# Patient Record
Sex: Female | Born: 2005 | Race: Black or African American | Hispanic: No | Marital: Single | Smoking: Never smoker
Health system: Southern US, Community
[De-identification: ages and names within clinical notes are randomized; demographics above are authoritative.]

## PROBLEM LIST (undated history)

## (undated) DIAGNOSIS — K219 Gastro-esophageal reflux disease without esophagitis: Secondary | ICD-10-CM

## (undated) DIAGNOSIS — F1910 Other psychoactive substance abuse, uncomplicated: Secondary | ICD-10-CM

## (undated) DIAGNOSIS — F419 Anxiety disorder, unspecified: Secondary | ICD-10-CM

## (undated) DIAGNOSIS — F32A Depression, unspecified: Secondary | ICD-10-CM

## (undated) DIAGNOSIS — J45909 Unspecified asthma, uncomplicated: Secondary | ICD-10-CM

## (undated) DIAGNOSIS — D649 Anemia, unspecified: Secondary | ICD-10-CM

## (undated) DIAGNOSIS — T7840XA Allergy, unspecified, initial encounter: Secondary | ICD-10-CM

## (undated) DIAGNOSIS — M810 Age-related osteoporosis without current pathological fracture: Secondary | ICD-10-CM

## (undated) HISTORY — DX: Age-related osteoporosis without current pathological fracture: M81.0

## (undated) HISTORY — DX: Allergy, unspecified, initial encounter: T78.40XA

## (undated) HISTORY — DX: Anxiety disorder, unspecified: F41.9

## (undated) HISTORY — DX: Unspecified asthma, uncomplicated: J45.909

## (undated) HISTORY — DX: Depression, unspecified: F32.A

## (undated) HISTORY — DX: Anemia, unspecified: D64.9

## (undated) HISTORY — DX: Other psychoactive substance abuse, uncomplicated: F19.10

## (undated) HISTORY — DX: Gastro-esophageal reflux disease without esophagitis: K21.9

---
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
## 2023-08-03 ENCOUNTER — Ambulatory Visit
Admission: EM | Admit: 2023-08-03 | Discharge: 2023-08-03 | Disposition: A | Discharge status: Home / Self Care | Attending: Family Medicine | Admitting: Family Medicine

## 2023-08-03 DIAGNOSIS — R3989 Other symptoms and signs involving the genitourinary system: Secondary | ICD-10-CM | POA: Diagnosis present

## 2023-08-03 DIAGNOSIS — N3001 Acute cystitis with hematuria: Secondary | ICD-10-CM | POA: Insufficient documentation

## 2023-08-03 DIAGNOSIS — R300 Dysuria: Secondary | ICD-10-CM | POA: Diagnosis present

## 2023-08-03 LAB — POCT URINALYSIS DIP (MANUAL ENTRY)
Bilirubin, UA: NEGATIVE
Glucose, UA: NEGATIVE mg/dL
Ketones, POC UA: NEGATIVE mg/dL
Nitrite, UA: POSITIVE — AB
Protein Ur, POC: NEGATIVE mg/dL
Spec Grav, UA: 1.01 (ref 1.010–1.025)
Urobilinogen, UA: 0.2 U/dL
pH, UA: 7.5 (ref 5.0–8.0)

## 2023-08-03 LAB — POCT URINE PREGNANCY: Preg Test, Ur: NEGATIVE

## 2023-08-03 MED ORDER — CEPHALEXIN 500 MG PO CAPS: 500.0000 mg | ORAL_CAPSULE | Freq: Two times a day (BID) | ORAL | 0 refills | Status: DC

## 2023-08-03 MED ORDER — FLUCONAZOLE 150 MG PO TABS: 150.0000 mg | ORAL_TABLET | ORAL | 0 refills | Status: DC

## 2023-08-03 NOTE — ED Triage Notes (Signed)
 Pt c/o of bladder,pressure after voiding on and off for 3 months and tick bite right lower abdomen on Sunday.Home intervention-AZO capsules.

## 2023-08-03 NOTE — ED Provider Notes (Signed)
 [facility]  Note:  This document was prepared using Conservation officer, historic buildings and may include unintentional dictation errors.  MRN: [id number] DOB: 29-Jul-2005  Subjective:   Yvette Kerr is a 18 y.o. female presenting for 63-month history of intermittent pelvic pressure, bladder pressure.  Her particular current symptoms started 5 days ago.  Has had dysuria and urinary urgency.  No vaginal discharge.  Would like to be checked for STIs just to be sure.  She has previously had difficulty with a yeast infection or BV infections.  Denies any particular vaginal malodor, thick vaginal discharge.  Reports that she has had minimal discharge and no odor.  No concern for pregnancy but is not opposed to testing.  Hydrates with 1-2 bottles of water daily, drinks a lot of fruit juice.  Limits other urinary irritants.  No current facility-administered medications for this encounter.  Current Outpatient Medications:    mirtazapine (REMERON) 7.5 MG tablet, Take 1-2 tablets (7.5-15 mg total) by mouth at bedtime., Disp: 30 tablet, Rfl: 0   No Known Allergies  History reviewed. No pertinent past medical history.   History reviewed. No pertinent surgical history.  No family history on file.  Social History   Tobacco Use   Smoking status: Former    Types: Cigarettes    Passive exposure: Yes  Vaping Use   Vaping status: Every Day  Substance Use Topics   Drug use: Yes    Types: Marijuana    ROS   Objective:   Vitals: BP 113/81 (BP Location: Left Arm)   Pulse 87   Temp 99.2 F (37.3 C) (Oral)   Resp 20   LMP 06/24/2023 (Approximate)   SpO2 98%   Physical Exam Constitutional:      General: She is not in acute distress.    Appearance: Normal appearance. She is well-developed. She is not ill-appearing, toxic-appearing or diaphoretic.  HENT:     Head: Normocephalic and atraumatic.     Nose: Nose normal.     Mouth/Throat:     Mouth: Mucous membranes  are moist.     Pharynx: Oropharynx is clear.  Eyes:     General: No scleral icterus.       Right eye: No discharge.        Left eye: No discharge.     Extraocular Movements: Extraocular movements intact.     Conjunctiva/sclera: Conjunctivae normal.  Cardiovascular:     Rate and Rhythm: Normal rate.  Pulmonary:     Effort: Pulmonary effort is normal.  Abdominal:     General: Bowel sounds are normal. There is no distension.     Palpations: Abdomen is soft. There is no mass.     Tenderness: There is no abdominal tenderness. There is no right CVA tenderness, left CVA tenderness, guarding or rebound.  Skin:    General: Skin is warm and dry.  Neurological:     General: No focal deficit present.     Mental Status: She is alert and oriented to person, place, and time.  Psychiatric:        Mood and Affect: Mood normal.        Behavior: Behavior normal.        Thought Content: Thought content normal.        Judgment: Judgment normal.     Results for orders placed or performed during the hospital encounter of 08/03/23 (from the past 24 hours)  POCT urine pregnancy     Status: None  Collection Time: 08/03/23  1:52 PM  Result Value Ref Range   Preg Test, Ur Negative Negative  POCT urinalysis dipstick     Status: Abnormal   Collection Time: 08/03/23  1:52 PM  Result Value Ref Range   Color, UA yellow yellow   Clarity, UA hazy (A) clear   Glucose, UA negative negative mg/dL   Bilirubin, UA negative negative   Ketones, POC UA negative negative mg/dL   Spec Grav, UA 0.272 5.366 - 1.025   Blood, UA small (A) negative   pH, UA 7.5 5.0 - 8.0   Protein Ur, POC negative negative mg/dL   Urobilinogen, UA 0.2 0.2 or 1.0 E.U./dL   Nitrite, UA Positive (A) Negative   Leukocytes, UA Moderate (2+) (A) Negative    Assessment and Plan :   PDMP not reviewed this encounter.  1. Acute cystitis with hematuria   2. Sensation of pressure in bladder area    Start cephalexin to address acute  cystitis with hematuria, urine culture pending.  Vaginal cytology pending.  Deferred BV and yeast infection testing given lack of symptoms.  However, will cover for secondary vaginitis from antibiotic use with fluconazole.  Counseled patient on potential for adverse effects with medications prescribed/recommended today, ER and return-to-clinic precautions discussed, patient verbalized understanding.    Adolph Hoop, [place] 08/03/23 [id number]

## 2023-08-03 NOTE — Discharge Instructions (Addendum)
 Please start cephalexin to address an urinary tract infection. Use fluconazole for yeast infection from taking antibiotics. Make sure you hydrate very well with plain water and a quantity of 64 ounces of water a day.  Please limit drinks that are considered urinary irritants such as soda, sweet tea, coffee, energy drinks, alcohol.  These can worsen your urinary and genital symptoms but also be the source of them.  I will let you know about your urine culture results through MyChart to see if we need to prescribe or change your antibiotics based off of those results.

## 2023-08-04 ENCOUNTER — Telehealth (HOSPITAL_COMMUNITY): Payer: Self-pay

## 2023-08-04 LAB — CERVICOVAGINAL ANCILLARY ONLY
Chlamydia: POSITIVE — AB
Comment: NEGATIVE
Comment: NEGATIVE
Comment: NORMAL
Neisseria Gonorrhea: NEGATIVE
Trichomonas: NEGATIVE

## 2023-08-04 MED ORDER — DOXYCYCLINE HYCLATE 100 MG PO TABS: 100.0000 mg | ORAL_TABLET | Freq: Two times a day (BID) | ORAL | 0 refills | Status: AC

## 2023-08-04 NOTE — Telephone Encounter (Signed)
 Per protocol, pt requires tx with Doxycycline.  Attempted to reach patient x1. LVM.  Rx sent to pharmacy on file.

## 2023-08-05 LAB — URINE CULTURE: Culture: >=100000 — AB

## 2023-08-07 ENCOUNTER — Encounter: Payer: Self-pay | Admitting: Pediatrics

## 2023-08-12 ENCOUNTER — Ambulatory Visit
Admission: RE | Admit: 2023-08-12 | Discharge: 2023-08-12 | Disposition: A | Discharge status: Home / Self Care | Source: Ambulatory Visit | Attending: Family Medicine | Admitting: Family Medicine

## 2023-08-12 VITALS — BP 108/74 | HR 78 | Temp 98.0°F | Resp 18

## 2023-08-12 DIAGNOSIS — A749 Chlamydial infection, unspecified: Secondary | ICD-10-CM | POA: Diagnosis present

## 2023-08-12 DIAGNOSIS — M545 Low back pain, unspecified: Secondary | ICD-10-CM | POA: Diagnosis present

## 2023-08-12 DIAGNOSIS — R102 Pelvic and perineal pain: Secondary | ICD-10-CM | POA: Diagnosis present

## 2023-08-12 LAB — POCT URINALYSIS DIP (MANUAL ENTRY)
Bilirubin, UA: NEGATIVE
Blood, UA: NEGATIVE
Glucose, UA: NEGATIVE mg/dL
Ketones, POC UA: NEGATIVE mg/dL
Nitrite, UA: NEGATIVE
Protein Ur, POC: 30 mg/dL — AB
Spec Grav, UA: 1.01 (ref 1.010–1.025)
Urobilinogen, UA: 0.2 U/dL
pH, UA: 7.5 (ref 5.0–8.0)

## 2023-08-12 LAB — POCT URINE PREGNANCY: Preg Test, Ur: NEGATIVE

## 2023-08-12 MED ORDER — CIPROFLOXACIN HCL 500 MG PO TABS: 500.0000 mg | ORAL_TABLET | Freq: Two times a day (BID) | ORAL | 0 refills | Status: DC

## 2023-08-12 MED ORDER — NAPROXEN 375 MG PO TABS: 375.0000 mg | ORAL_TABLET | Freq: Two times a day (BID) | ORAL | 0 refills | Status: DC

## 2023-08-12 NOTE — Discharge Instructions (Addendum)
 Keep taking doxycycline  to completion. I am adding ciprofloxacin as an antibiotic to try and address a suspected ongoing UTI infection that did not completely resolve with Keflex . We will let you know your urine culture results on Monday. Use naproxen for pain and inflammation.

## 2023-08-12 NOTE — ED Triage Notes (Signed)
 Pt c/o back and pelvic pain and urinary frequency..She has bladder spasms at night.Finished keflex  course.

## 2023-08-12 NOTE — ED Provider Notes (Signed)
 [facility]  Note:  This document was prepared using Conservation officer, historic buildings and may include unintentional dictation errors.  MRN: [id number] DOB: [birth year]  Subjective:   Yvette Repetto is a 18 y.o. female presenting for persistent pelvic pain, urinary frequency and urgency.  She has also had some low back pain.  Patient was recently seen on 08/04/2023.  Was diagnosed with an urinary tract infection, started on cephalexin .  Urine culture confirmed this infection and sensitivities were consistent with the antibiotic choice.  She was also found to have chlamydia and is currently taking doxycycline .  No vaginal discharge, vaginal bleeding, hematuria.  No current facility-administered medications for this encounter.  Current Outpatient Medications:    cephALEXin  (KEFLEX ) 500 MG capsule, Take 1 capsule (500 mg total) by mouth 2 (two) times daily., Disp: 10 capsule, Rfl: 0   fluconazole  (DIFLUCAN ) 150 MG tablet, Take 1 tablet (150 mg total) by mouth once a week., Disp: 2 tablet, Rfl: 0   mirtazapine  (REMERON ) 7.5 MG tablet, Take 1-2 tablets (7.5-15 mg total) by mouth at bedtime., Disp: 30 tablet, Rfl: 0   No Known Allergies  History reviewed. No pertinent past medical history.   History reviewed. No pertinent surgical history.  History reviewed. No pertinent family history.  Social History   Tobacco Use   Smoking status: Former    Types: Cigarettes    Passive exposure: Yes  Vaping Use   Vaping status: Every Day  Substance Use Topics   Drug use: Yes    Types: Marijuana    ROS   Objective:   Vitals: BP 108/74 (BP Location: Right Arm)   Pulse 78   Temp 98 F (36.7 C) (Oral)   Resp 18   LMP 06/24/2023 (Approximate)   SpO2 95%   Physical Exam Constitutional:      General: She is not in acute distress.    Appearance: Normal appearance. She is well-developed. She is not ill-appearing, toxic-appearing or diaphoretic.  HENT:     Head:  Normocephalic and atraumatic.     Nose: Nose normal.     Mouth/Throat:     Mouth: Mucous membranes are moist.     Pharynx: Oropharynx is clear.  Eyes:     General: No scleral icterus.       Right eye: No discharge.        Left eye: No discharge.     Extraocular Movements: Extraocular movements intact.     Conjunctiva/sclera: Conjunctivae normal.  Cardiovascular:     Rate and Rhythm: Normal rate.  Pulmonary:     Effort: Pulmonary effort is normal.  Abdominal:     General: Bowel sounds are normal. There is no distension.     Palpations: Abdomen is soft. There is no mass.     Tenderness: There is abdominal tenderness (over areas outlined, mild) in the right lower quadrant and suprapubic area. There is no right CVA tenderness, left CVA tenderness, guarding or rebound.    Skin:    General: Skin is warm and dry.  Neurological:     General: No focal deficit present.     Mental Status: She is alert and oriented to person, place, and time.  Psychiatric:        Mood and Affect: Mood normal.        Behavior: Behavior normal.        Thought Content: Thought content normal.        Judgment: Judgment normal.     Results  for orders placed or performed during the hospital encounter of 08/12/23 (from the past 24 hours)  POCT urinalysis dipstick     Status: Abnormal   Collection Time: 08/12/23  5:09 PM  Result Value Ref Range   Color, UA yellow yellow   Clarity, UA clear clear   Glucose, UA negative negative mg/dL   Bilirubin, UA negative negative   Ketones, POC UA negative negative mg/dL   Spec Grav, UA 1.610 9.604 - 1.025   Blood, UA negative negative   pH, UA 7.5 5.0 - 8.0   Protein Ur, POC =30 (A) negative mg/dL   Urobilinogen, UA 0.2 0.2 or 1.0 E.U./dL   Nitrite, UA Negative Negative   Leukocytes, UA Trace (A) Negative  POCT urine pregnancy     Status: None   Collection Time: 08/12/23  5:09 PM  Result Value Ref Range   Preg Test, Ur Negative Negative    Assessment and Plan  :   PDMP not reviewed this encounter.  1. Acute pelvic pain, female   2. Chlamydia infection   3. Acute bilateral low back pain without sciatica    Recommended managing for an ongoing acute cystitis with ciprofloxacin.  Maintain doxycycline .  Will pursue repeat urine culture.  Defer vaginal cytology.  No signs of PID, recommended maintaining strict ER precautions.  Naproxen for pain and inflammation.  Counseled patient on potential for adverse effects with medications prescribed/recommended today, ER and return-to-clinic precautions discussed, patient verbalized understanding.    Adolph Hoop, [place] 08/12/23 1759

## 2023-08-14 LAB — URINE CULTURE: Culture: <10000 — AB

## 2023-09-05 ENCOUNTER — Ambulatory Visit: Admission: EM | Admit: 2023-09-05 | Discharge: 2023-09-05 | Disposition: A | Discharge status: Home / Self Care

## 2023-09-05 NOTE — ED Notes (Addendum)
 Patient wanted to be seen for Well women exam. Patient decided to leave and go to the health department.

## 2023-09-06 ENCOUNTER — Emergency Department (HOSPITAL_BASED_OUTPATIENT_CLINIC_OR_DEPARTMENT_OTHER)
Admission: EM | Admit: 2023-09-06 | Discharge: 2023-09-06 | Disposition: A | Discharge status: Home / Self Care | Attending: Emergency Medicine | Admitting: Emergency Medicine

## 2023-09-06 ENCOUNTER — Encounter (HOSPITAL_BASED_OUTPATIENT_CLINIC_OR_DEPARTMENT_OTHER): Payer: Self-pay | Admitting: Emergency Medicine

## 2023-09-06 ENCOUNTER — Other Ambulatory Visit: Payer: Self-pay

## 2023-09-06 DIAGNOSIS — R300 Dysuria: Secondary | ICD-10-CM | POA: Diagnosis present

## 2023-09-06 DIAGNOSIS — N898 Other specified noninflammatory disorders of vagina: Secondary | ICD-10-CM

## 2023-09-06 DIAGNOSIS — N760 Acute vaginitis: Secondary | ICD-10-CM | POA: Insufficient documentation

## 2023-09-06 DIAGNOSIS — B9689 Other specified bacterial agents as the cause of diseases classified elsewhere: Secondary | ICD-10-CM

## 2023-09-06 LAB — WET PREP, GENITAL
Sperm: NONE SEEN
Trich, Wet Prep: NONE SEEN
WBC, Wet Prep HPF POC: >=10 — AB (ref <10)
Yeast Wet Prep HPF POC: NONE SEEN

## 2023-09-06 LAB — URINALYSIS, ROUTINE W REFLEX MICROSCOPIC
Bilirubin Urine: NEGATIVE
Glucose, UA: NEGATIVE mg/dL
Hgb urine dipstick: NEGATIVE
Ketones, ur: NEGATIVE mg/dL
Leukocytes,Ua: NEGATIVE
Nitrite: NEGATIVE
Protein, ur: NEGATIVE mg/dL
Specific Gravity, Urine: 1.025 (ref 1.005–1.030)
pH: 6 (ref 5.0–8.0)

## 2023-09-06 LAB — PREGNANCY, URINE: Preg Test, Ur: NEGATIVE

## 2023-09-06 MED ORDER — METRONIDAZOLE 500 MG PO TABS
500.0000 mg | ORAL_TABLET | Freq: Once | ORAL | Status: AC
  Administered 2023-09-06: 500 mg via ORAL

## 2023-09-06 MED ORDER — CEFTRIAXONE SODIUM 500 MG IJ SOLR
INTRAMUSCULAR | Status: DC
Start: 2023-09-06 — End: 2023-09-07
  Filled 2023-09-06: qty 500

## 2023-09-06 MED ORDER — METRONIDAZOLE 500 MG PO TABS: 500.0000 mg | ORAL_TABLET | Freq: Two times a day (BID) | ORAL | 0 refills | Status: DC

## 2023-09-06 MED ORDER — LIDOCAINE HCL (PF) 1 % IJ SOLN
5.0000 mL | Freq: Once | INTRAMUSCULAR | Status: AC
  Administered 2023-09-06: 5 mL

## 2023-09-06 MED ORDER — LIDOCAINE HCL (PF) 1 % IJ SOLN
INTRAMUSCULAR | Status: DC
Start: 2023-09-06 — End: 2023-09-07
  Filled 2023-09-06: qty 5

## 2023-09-06 MED ORDER — DOXYCYCLINE HYCLATE 100 MG PO CAPS: 100.0000 mg | ORAL_CAPSULE | Freq: Two times a day (BID) | ORAL | 0 refills | Status: AC

## 2023-09-06 MED ORDER — DOXYCYCLINE HYCLATE 100 MG PO TABS
100.0000 mg | ORAL_TABLET | Freq: Once | ORAL | Status: AC
  Administered 2023-09-06: 100 mg via ORAL

## 2023-09-06 MED ORDER — DOXYCYCLINE HYCLATE 100 MG PO TABS
ORAL_TABLET | ORAL | Status: AC
  Filled 2023-09-06: qty 1

## 2023-09-06 MED ORDER — METRONIDAZOLE 500 MG PO TABS
ORAL_TABLET | ORAL | Status: DC
Start: 2023-09-06 — End: 2023-09-07
  Filled 2023-09-06: qty 1

## 2023-09-06 MED ORDER — CEFTRIAXONE SODIUM 500 MG IJ SOLR
500.0000 mg | Freq: Once | INTRAMUSCULAR | Status: AC
  Administered 2023-09-06: 500 mg via INTRAMUSCULAR

## 2023-09-06 NOTE — ED Provider Notes (Signed)
 Patient not seen, per CMA, she was looking for a well woman exam.   Adolph Hoop, PA-C 09/06/23 1038

## 2023-09-06 NOTE — ED Provider Notes (Signed)
 [facility] Provider Note   CSN: [id number] Arrival date & time: 09/06/23  1826     History  Chief Complaint  Patient presents with   Urinary Frequency    Yvette Kerr is a 18 y.o. female.   Urinary Frequency     18 year old female with medical history significant for chlamydia infection, presenting to the emergency department with roughly 2 months of dysuria and lower back discomfort.  The patient also states that she has been having some discomfort in her vaginal area, thinks it is dry and cracked.  She does not think she has had any vaginal lesions, denies any history of HSV.  She denies any hematuria.  She states that a few months back she tested positive for chlamydia but was treated with doxycycline .  She denies any fevers or chills, no significant pelvic discomfort, no vomiting.  Home Medications Prior to Admission medications   Medication Sig Start Date End Date Taking? Authorizing Provider  doxycycline  (VIBRAMYCIN ) 100 MG capsule Take 1 capsule (100 mg total) by mouth 2 (two) times daily for 7 days. 09/06/23 09/13/23 Yes Rosealee Concha, MD  metroNIDAZOLE (FLAGYL) 500 MG tablet Take 1 tablet (500 mg total) by mouth 2 (two) times daily. 09/06/23  Yes Rosealee Concha, MD  fluconazole  (DIFLUCAN ) 150 MG tablet Take 1 tablet (150 mg total) by mouth once a week. 08/03/23   Adolph Hoop, PA-C  mirtazapine  (REMERON ) 7.5 MG tablet Take 1-2 tablets (7.5-15 mg total) by mouth at bedtime. 11/24/21   Ann Keto, MD  naproxen  (NAPROSYN ) 375 MG tablet Take 1 tablet (375 mg total) by mouth 2 (two) times daily with a meal. 08/12/23   Adolph Hoop, PA-C      Allergies    Patient has no known allergies.    Review of Systems   Review of Systems  Genitourinary:  Positive for frequency and vaginal pain.  All other systems reviewed and are negative.   Physical Exam Updated Vital Signs BP 102/74 (BP Location: Right Arm)   Pulse 90   Temp  98.7 F (37.1 C) (Oral)   Resp 13   Ht 5' (1.524 m)   Wt 39.6 kg   LMP  (LMP Unknown)   SpO2 100%   BMI 17.07 kg/m  Physical Exam Vitals and nursing note reviewed. Exam conducted with a chaperone present.  Constitutional:      General: She is not in acute distress.    Appearance: She is well-developed.  HENT:     Head: Normocephalic and atraumatic.  Eyes:     Conjunctiva/sclera: Conjunctivae normal.  Cardiovascular:     Rate and Rhythm: Normal rate and regular rhythm.     Heart sounds: No murmur heard. Pulmonary:     Effort: Pulmonary effort is normal. No respiratory distress.     Breath sounds: Normal breath sounds.  Abdominal:     Palpations: Abdomen is soft.     Tenderness: There is no abdominal tenderness.  Genitourinary:    Comments: Mild cervical discharge, no cervical motion tenderness, no adnexal tenderness bilaterally, cervix is closed, no bleeding.  No evidence of vaginal lesions, no folliculitis, no HSV Musculoskeletal:        General: No swelling.     Cervical back: Neck supple.  Skin:    General: Skin is warm and dry.     Capillary Refill: Capillary refill takes less than 2 seconds.  Neurological:     Mental Status: She is alert.  Psychiatric:  Mood and Affect: Mood normal.     ED Results / Procedures / Treatments   Labs (all labs ordered are listed, but only abnormal results are displayed) Labs Reviewed  WET PREP, GENITAL - Abnormal; Notable for the following components:      Result Value   Clue Cells Wet Prep HPF POC PRESENT (*)    WBC, Wet Prep HPF POC >=10 (*)    All other components within normal limits  URINALYSIS, ROUTINE W REFLEX MICROSCOPIC  PREGNANCY, URINE  GC/CHLAMYDIA PROBE AMP ([place]) NOT AT [facility]    EKG None  Radiology No results found.  Procedures Procedures    Medications Ordered in ED Medications  cefTRIAXone (ROCEPHIN) injection 500 mg (has no administration in time range)  doxycycline  (VIBRA -TABS) tablet  100 mg (has no administration in time range)  metroNIDAZOLE (FLAGYL) tablet 500 mg (has no administration in time range)    ED Course/ Medical Decision Making/ A&P Clinical Course as of 09/06/23 2144  Tue Sep 06, 2023  2139 Clue Cells Wet Prep HPF POC(!): PRESENT [JL]    Clinical Course User Index [JL] Rosealee Concha, MD                                 Medical Decision Making Amount and/or Complexity of Data Reviewed Labs: ordered. Decision-making details documented in ED Course.  Risk Prescription drug management.    18 year old female with medical history significant for chlamydia infection, presenting to the emergency department with roughly 2 months of dysuria and lower back discomfort.  The patient also states that she has been having some discomfort in her vaginal area, thinks it is dry and cracked.  She does not think she has had any vaginal lesions, denies any history of HSV.  She denies any hematuria.  She states that a few months back she tested positive for chlamydia but was treated with doxycycline .  She denies any fevers or chills, no significant pelvic discomfort, no vomiting.  On arrival, the patient was afebrile, not tachycardic or tachypneic, BP 102/74, saturating her percent on room air.  The patient was consented for pelvic exam which was performed with a chaperone.  No evidence of vaginal lesions or folliculitis, no evidence of HSV.  The patient had mild cervical discharge, no cervical motion tenderness, no adnexal tenderness.  She was offered STI testing and consented and GC and Chlamydia testing was collected and pending.  She declined HIV and RPR testing via blood draw.  Urine pregnancy was negative, urinalysis without hematuria or evidence of UTI.  She was covered empirically for STIs.  Her wet prep resulted positive for bacterial vaginosis. Patient endorsing some symptoms of PID however her abdomen is overall nontender on exam. She will be empirically treated for GC  and chlamydia and we will treat her for bacterial vaginosis.  Overall at this time, the patient is stable for discharge with continued outpatient follow-up with the PCP, return precautions provided.   Final Clinical Impression(s) / ED Diagnoses Final diagnoses:  Vaginal irritation  Dysuria  Bacterial vaginosis    Rx / DC Orders ED Discharge Orders          Ordered    metroNIDAZOLE (FLAGYL) 500 MG tablet  2 times daily        09/06/23 2141    doxycycline  (VIBRAMYCIN ) 100 MG capsule  2 times daily        09/06/23 2141  Rosealee Concha, MD 09/06/23 2144

## 2023-09-06 NOTE — ED Triage Notes (Signed)
 Pt via POV c/o UTI symptoms x 2 months with lower back pain rated 5/10 intermittently. Pt says she has burning with urination but thinks her skin is irritated and "cracked." Denies hematuria.

## 2023-09-06 NOTE — Discharge Instructions (Addendum)
 Your STI testing will be available on the patient portal in the next 2 days.  We have empirically covered you for gonorrhea and chlamydia.  Your wet prep was positive for bacterial vaginosis for which we will treat with a course of Flagyl.  Do not drink alcohol while taking Flagyl as this can cause an adverse reaction.

## 2023-09-08 LAB — GC/CHLAMYDIA PROBE AMP (~~LOC~~) NOT AT ARMC
Chlamydia: NEGATIVE
Comment: NEGATIVE
Comment: NORMAL
Neisseria Gonorrhea: NEGATIVE

## 2023-09-15 ENCOUNTER — Ambulatory Visit
Admission: RE | Admit: 2023-09-15 | Discharge: 2023-09-15 | Disposition: A | Discharge status: Home / Self Care | Payer: Self-pay | Source: Ambulatory Visit | Attending: Nurse Practitioner | Admitting: Nurse Practitioner

## 2023-09-15 VITALS — BP 107/70 | HR 73 | Temp 98.4°F | Resp 16

## 2023-09-15 DIAGNOSIS — R634 Abnormal weight loss: Secondary | ICD-10-CM | POA: Diagnosis not present

## 2023-09-15 DIAGNOSIS — Z862 Personal history of diseases of the blood and blood-forming organs and certain disorders involving the immune mechanism: Secondary | ICD-10-CM | POA: Diagnosis not present

## 2023-09-15 DIAGNOSIS — R002 Palpitations: Secondary | ICD-10-CM | POA: Diagnosis not present

## 2023-09-15 DIAGNOSIS — R5383 Other fatigue: Secondary | ICD-10-CM

## 2023-09-15 NOTE — ED Provider Notes (Signed)
 [facility]    CSN: [id number] Arrival date & time: 09/15/23  1306      History   Chief Complaint Chief Complaint  Patient presents with   Fatigue    Family history of thyroid Need to get Thyroid blood work done asap - Entered by patient    HPI Yvette Kerr is a 18 y.o. female.   Yvette Kerr is a 18 y.o. female that presents with her mother, who lives in [place] , on the telephone with multiple complaints including heart palpitations, hot flashes, shakiness, weight loss, shortness of breath, and headache. The patient reports experiencing heart palpitations that are not painful but can be uncomfortable, especially when relaxing. Hot flashes occur for about 15 minutes at a time without associated sweating. The patient describes shakiness, particularly noticing hand tremors when feeling sad or upset. The patient reports significant weight loss of approximately 20 pounds or more over an unspecified period. The most recent weight recorded was 87 pounds about two weeks ago. Shortness of breath is also reported, along with a current headache. The patient denies chest pain, bruising, or bleeding easily. Regarding recent medical history, the patient mentions being treated for a UTI last month that caused pain. They also report being bitten by a tick in April, which was evaluated and determined to be fine. The patient's last menstrual period started a couple of days ago. She's on birth control. She reports a history of anemia and low iron. She does not take any supplements or prescribed medications. Mother is concerned for thyroid problems as their is a family history of this. The patient denies nausea, vomiting, abdominal pain, neck pain or stiffness, dizziness, rash, and current urinary or bowel issues. They do not have a primary care provider but do have insurance.  The following portions of the patient's history were reviewed and updated as appropriate: allergies, current medications,  past family history, past medical history, past social history, past surgical history, and problem list.    History reviewed. No pertinent past medical history.  There are no active problems to display for this patient.   History reviewed. No pertinent surgical history.  OB History   No obstetric history on file.      Home Medications    Prior to Admission medications   Not on File    Family History History reviewed. No pertinent family history.  Social History Social History   Tobacco Use   Smoking status: Former    Types: Cigarettes    Passive exposure: Yes  Vaping Use   Vaping status: Every Day  Substance Use Topics   Drug use: Yes    Types: Marijuana     Allergies   Patient has no known allergies.   Review of Systems Review of Systems  Constitutional:  Positive for fatigue and unexpected weight change (20 pound weight loss over the past 1-2 years; initially weighing around 125 pounds).       Episodes of hot flashes but no sweating  Respiratory:  Positive for chest tightness (sometimes; associated with the SOB) and shortness of breath (sometimes).   Cardiovascular:  Positive for palpitations (painless but uncomfortable).  Gastrointestinal:  Positive for nausea (frequent nausea in the mornings). Negative for vomiting.  Genitourinary:  Negative for menstrual problem (currently on cycle, started a couple of days ago; on birth control).       No urinary issues currently; treated for a UTI last month   Musculoskeletal:  Negative for neck pain and neck stiffness.  Skin:  Negative for rash and wound (tick bite back in April but no wounds currently).  Neurological:  Positive for tremors (in both hands) and headaches. Negative for dizziness.  Hematological:        Hx anemia and low iron   Psychiatric/Behavioral:  The patient is nervous/anxious.   All other systems reviewed and are negative.    Physical Exam Triage Vital Signs ED Triage Vitals [09/15/23  1342]  Encounter Vitals Group     BP 107/70     Systolic BP Percentile      Diastolic BP Percentile      Pulse Rate 73     Resp 16     Temp 98.4 F (36.9 C)     Temp Source Oral     SpO2 98 %     Weight      Height      Head Circumference      Peak Flow      Pain Score      Pain Loc      Pain Education      Exclude from Growth Chart    No data found.  Updated Vital Signs BP 107/70 (BP Location: Left Arm)   Pulse 73   Temp 98.4 F (36.9 C) (Oral)   Resp 16   LMP 09/13/2023   SpO2 98%   Visual Acuity Right Eye Distance:   Left Eye Distance:   Bilateral Distance:    Right Eye Near:   Left Eye Near:    Bilateral Near:     Physical Exam Vitals and nursing note reviewed.  Constitutional:      General: She is not in acute distress.    Appearance: Normal appearance. She is not toxic-appearing.  HENT:     Head: Normocephalic.     Mouth/Throat:     Mouth: Mucous membranes are moist.  Eyes:     Conjunctiva/sclera: Conjunctivae normal.  Cardiovascular:     Rate and Rhythm: Normal rate and regular rhythm.     Heart sounds: Normal heart sounds.  Pulmonary:     Effort: Pulmonary effort is normal.     Breath sounds: Normal breath sounds.  Abdominal:     Palpations: Abdomen is soft.     Tenderness: There is no abdominal tenderness.  Musculoskeletal:        General: Normal range of motion.     Cervical back: Normal range of motion and neck supple.  Skin:    General: Skin is warm and dry.     Findings: No rash.  Neurological:     General: No focal deficit present.     Mental Status: She is alert and oriented to person, place, and time.     Cranial Nerves: No cranial nerve deficit.     Sensory: Sensation is intact. No sensory deficit.     Motor: Motor function is intact. No weakness.     Coordination: Coordination is intact.     Gait: Gait is intact.      UC Treatments / Results  Labs (all labs ordered are listed, but only abnormal results are  displayed) Labs Reviewed  CBC WITH DIFFERENTIAL/PLATELET  TSH  T4, FREE  IRON AND TIBC  FERRITIN    EKG   Radiology No results found.  Procedures Procedures (including critical care time)  Medications Ordered in UC Medications - No data to display  Initial Impression / Assessment and Plan / UC Course  I have reviewed the triage vital signs and  the nursing notes.  Pertinent labs & imaging results that were available during my care of the patient were reviewed by me and considered in my medical decision making (see chart for details).     Patient presents with symptoms suggestive of possible thyroid dysfunction, including palpitations, hot flashes, shakiness, and approximately 20-pound weight loss over an unspecified period. Although a TSH in August 2023 was reported as normal, the current constellation of symptoms raises concern for possible thyrotoxicosis, and thyroid function testing including TSH was ordered for further evaluation. Additionally, the patient's symptoms and weight loss may also be consistent with anemia. Although previous hemoglobin and platelet counts were normal in August 2023, current labs including a CBC and iron studies with ferritin were ordered. A daily multivitamin with iron or a prenatal vitamin was recommended along with a well-balanced diet. The patient also reports a current headache without further detail; over-the-counter analgesics such as acetaminophen or ibuprofen  were recommended as needed. Given the extent of weight loss and reported symptoms, maintaining adequate hydration was emphasized, and the patient was advised to increase fluid intake. Follow-up will be based on lab results. PCP assistance have been requested for routine health maintenance and further evaluation of her chronic complaints.  Today's evaluation has revealed no signs of a dangerous process. Discussed diagnosis with patient and/or guardian. Patient and/or guardian aware of their  diagnosis, possible red flag symptoms to watch out for and need for close follow up. Patient and/or guardian understands verbal and written discharge instructions. Patient and/or guardian comfortable with plan and disposition.  Patient and/or guardian has a clear mental status at this time, good insight into illness (after discussion and teaching) and has clear judgment to make decisions regarding their care  Documentation was completed with the aid of voice recognition software. Transcription may contain typographical errors.  Final Clinical Impressions(s) / UC Diagnoses   Final diagnoses:  Fatigue, unspecified type  Palpitations  Unintentional weight loss  History of iron deficiency anemia     Discharge Instructions      You were seen today for symptoms that may be related to a thyroid issue, anemia, or both. You reported experiencing heart palpitations, hot flashes, shakiness, and a significant weight loss of around 20 pounds over the past couple of years. These symptoms warrant further evaluation.  We have ordered lab work to check your thyroid function, including a TSH level, to determine if your symptoms could be related to an overactive thyroid (also known as thyrotoxicosis). Even though your thyroid level was normal in August 2023, changes can happen over time, and new testing is important.  We also ordered blood work to evaluate for anemia. This includes a complete blood count (CBC) and iron studies such as ferritin. Anemia can cause symptoms like palpitations, fatigue, and shortness of breath, and it's important to rule this out given your current symptoms and weight loss.   In the meantime, you are encouraged to take a daily multivitamin that contains iron, or a prenatal vitamin, to support your iron levels. A well-balanced diet that includes iron-rich foods such as leafy greens, beans, lean meats, and fortified cereals is also recommended. Because of your recent weight loss and  other symptoms, it's also important to stay well hydrated. Please make an effort to drink plenty of water throughout the day to support your overall health.  For headache relief, you may take over-the-counter medications such as acetaminophen or ibuprofen  as needed.   Assistance for a primary care provider (PCP) has been  requested to help with routine health maintenance and further evaluation of your chronic symptoms. Once your lab results are available, we will review them and follow up with you to discuss next steps. If you begin to feel worse or develop new symptoms such as increased shortness of breath, dizziness, chest pain, or confusion, please seek medical care right away.    ED Prescriptions   None    PDMP not reviewed this encounter.   Maryruth Sol, [place] 09/15/23 1435

## 2023-09-15 NOTE — ED Triage Notes (Signed)
 Pt presents to the office for fatigue x 2 months. Patient states her mother would like her thyroid check. Patient states she has history of anxiety, she has noticed a difference in her mood since she is not on medication.

## 2023-09-15 NOTE — Discharge Instructions (Addendum)
 You were seen today for symptoms that may be related to a thyroid issue, anemia, or both. You reported experiencing heart palpitations, hot flashes, shakiness, and a significant weight loss of around 20 pounds over the past couple of years. These symptoms warrant further evaluation.  We have ordered lab work to check your thyroid function, including a TSH level, to determine if your symptoms could be related to an overactive thyroid (also known as thyrotoxicosis). Even though your thyroid level was normal in August 2023, changes can happen over time, and new testing is important.  We also ordered blood work to evaluate for anemia. This includes a complete blood count (CBC) and iron studies such as ferritin. Anemia can cause symptoms like palpitations, fatigue, and shortness of breath, and it's important to rule this out given your current symptoms and weight loss.   In the meantime, you are encouraged to take a daily multivitamin that contains iron, or a prenatal vitamin, to support your iron levels. A well-balanced diet that includes iron-rich foods such as leafy greens, beans, lean meats, and fortified cereals is also recommended. Because of your recent weight loss and other symptoms, it's also important to stay well hydrated. Please make an effort to drink plenty of water throughout the day to support your overall health.  For headache relief, you may take over-the-counter medications such as acetaminophen or ibuprofen  as needed.   Assistance for a primary care provider (PCP) has been requested to help with routine health maintenance and further evaluation of your chronic symptoms. Once your lab results are available, we will review them and follow up with you to discuss next steps. If you begin to feel worse or develop new symptoms such as increased shortness of breath, dizziness, chest pain, or confusion, please seek medical care right away.

## 2023-09-16 ENCOUNTER — Ambulatory Visit (HOSPITAL_COMMUNITY): Payer: Self-pay

## 2023-09-16 LAB — T4, FREE: Free T4: 1.35 ng/dL (ref 0.93–1.60)

## 2023-09-16 LAB — CBC WITH DIFFERENTIAL/PLATELET
Basophils Absolute: 0 10*3/uL (ref 0.0–0.2)
Basos: 0 %
EOS (ABSOLUTE): 0.1 10*3/uL (ref 0.0–0.4)
Eos: 1 %
Hematocrit: 38.7 % (ref 34.0–46.6)
Hemoglobin: 12.9 g/dL (ref 11.1–15.9)
Immature Grans (Abs): 0 10*3/uL (ref 0.0–0.1)
Immature Granulocytes: 0 %
Lymphocytes Absolute: 2.2 10*3/uL (ref 0.7–3.1)
Lymphs: 30 %
MCH: 31.2 pg (ref 26.6–33.0)
MCHC: 33.3 g/dL (ref 31.5–35.7)
MCV: 94 fL (ref 79–97)
Monocytes Absolute: 0.4 10*3/uL (ref 0.1–0.9)
Monocytes: 5 %
Neutrophils Absolute: 4.8 10*3/uL (ref 1.4–7.0)
Neutrophils: 64 %
Platelets: 274 10*3/uL (ref 150–450)
RBC: 4.14 x10E6/uL (ref 3.77–5.28)
RDW: 12.4 % (ref 11.7–15.4)
WBC: 7.5 10*3/uL (ref 3.4–10.8)

## 2023-09-16 LAB — FERRITIN: Ferritin: 42 ng/mL (ref 15–77)

## 2023-09-16 LAB — IRON AND TIBC
Iron Saturation: 20 % (ref 15–55)
Iron: 52 ug/dL (ref 27–159)
Total Iron Binding Capacity: 259 ug/dL (ref 250–450)
UIBC: 207 ug/dL (ref 131–425)

## 2023-09-16 LAB — TSH: TSH: 0.865 u[IU]/mL (ref 0.450–4.500)

## 2023-11-29 ENCOUNTER — Encounter: Payer: Self-pay | Admitting: Family Medicine

## 2023-11-29 ENCOUNTER — Ambulatory Visit (INDEPENDENT_AMBULATORY_CARE_PROVIDER_SITE_OTHER): Admitting: Family Medicine

## 2023-11-29 VITALS — BP 104/72 | HR 70 | Temp 97.8°F | Ht 60.0 in | Wt 91.0 lb

## 2023-11-29 DIAGNOSIS — R636 Underweight: Secondary | ICD-10-CM | POA: Insufficient documentation

## 2023-11-29 DIAGNOSIS — R202 Paresthesia of skin: Secondary | ICD-10-CM | POA: Diagnosis not present

## 2023-11-29 DIAGNOSIS — Z8349 Family history of other endocrine, nutritional and metabolic diseases: Secondary | ICD-10-CM | POA: Insufficient documentation

## 2023-11-29 DIAGNOSIS — F419 Anxiety disorder, unspecified: Secondary | ICD-10-CM

## 2023-11-29 DIAGNOSIS — R6889 Other general symptoms and signs: Secondary | ICD-10-CM | POA: Diagnosis not present

## 2023-11-29 DIAGNOSIS — Z862 Personal history of diseases of the blood and blood-forming organs and certain disorders involving the immune mechanism: Secondary | ICD-10-CM | POA: Insufficient documentation

## 2023-11-29 DIAGNOSIS — N926 Irregular menstruation, unspecified: Secondary | ICD-10-CM | POA: Diagnosis not present

## 2023-11-29 DIAGNOSIS — F32A Depression, unspecified: Secondary | ICD-10-CM | POA: Insufficient documentation

## 2023-11-29 DIAGNOSIS — Z7689 Persons encountering health services in other specified circumstances: Secondary | ICD-10-CM

## 2023-11-29 DIAGNOSIS — R5383 Other fatigue: Secondary | ICD-10-CM | POA: Diagnosis not present

## 2023-11-29 DIAGNOSIS — Z1159 Encounter for screening for other viral diseases: Secondary | ICD-10-CM

## 2023-11-29 LAB — CBC WITH DIFFERENTIAL/PLATELET
Basophils Absolute: 0 K/uL (ref 0.0–0.1)
Basophils Relative: 0.5 % (ref 0.0–3.0)
Eosinophils Absolute: 0.1 K/uL (ref 0.0–0.7)
Eosinophils Relative: 2.3 % (ref 0.0–5.0)
HCT: 41 % (ref 36.0–46.0)
Hemoglobin: 13.8 g/dL (ref 12.0–15.0)
Lymphocytes Relative: 29 % (ref 12.0–46.0)
Lymphs Abs: 1.6 K/uL (ref 0.7–4.0)
MCHC: 33.6 g/dL (ref 30.0–36.0)
MCV: 90.7 fl (ref 78.0–100.0)
Monocytes Absolute: 0.3 K/uL (ref 0.1–1.0)
Monocytes Relative: 5.3 % (ref 3.0–12.0)
Neutro Abs: 3.5 K/uL (ref 1.4–7.7)
Neutrophils Relative %: 62.9 % (ref 43.0–77.0)
Platelets: 275 K/uL (ref 150.0–400.0)
RBC: 4.52 Mil/uL (ref 3.87–5.11)
RDW: 13.1 % (ref 11.5–14.6)
WBC: 5.6 K/uL (ref 4.5–10.5)

## 2023-11-29 LAB — FERRITIN: Ferritin: 22.1 ng/mL (ref 10.0–291.0)

## 2023-11-29 LAB — COMPREHENSIVE METABOLIC PANEL WITH GFR
ALT: 14 U/L (ref 0–35)
AST: 19 U/L (ref 0–37)
Albumin: 4.7 g/dL (ref 3.5–5.2)
Alkaline Phosphatase: 57 U/L (ref 39–117)
BUN: 8 mg/dL (ref 6–23)
CO2: 27 meq/L (ref 19–32)
Calcium: 9.4 mg/dL (ref 8.4–10.5)
Chloride: 106 meq/L (ref 96–112)
Creatinine, Ser: 0.77 mg/dL (ref 0.40–1.20)
GFR: 111.26 mL/min (ref >60.00)
Glucose, Bld: 92 mg/dL (ref 70–99)
Potassium: 4.1 meq/L (ref 3.5–5.1)
Sodium: 139 meq/L (ref 135–145)
Total Bilirubin: 0.6 mg/dL (ref 0.2–1.2)
Total Protein: 7.5 g/dL (ref 6.0–8.3)

## 2023-11-29 LAB — T4, FREE: Free T4: 0.9 ng/dL (ref 0.60–1.60)

## 2023-11-29 LAB — TSH: TSH: 0.93 u[IU]/mL (ref 0.35–5.50)

## 2023-11-29 LAB — FOLATE: Folate: 10.3 ng/mL (ref >5.9)

## 2023-11-29 LAB — HEMOGLOBIN A1C: Hgb A1c MFr Bld: 5.7 % (ref 4.6–6.5)

## 2023-11-29 LAB — VITAMIN B12: Vitamin B-12: 389 pg/mL (ref 211–911)

## 2023-11-29 NOTE — Progress Notes (Signed)
 New Patient Office Visit  Subjective    Patient ID: Yvette Kerr, female    DOB: March 26, 2006  Age: 18 y.o. MRN: [id number]  CC:  Chief Complaint  Patient presents with   Establish Care    Trying to figure out if she has thyroid issues, is having issues gaining weight. Hx of thyroid in the family    HPI Yvette Kerr presents to establish care Previous PCP- none   Other providers: None   Weight loss in 2023, lost approx 30 lbs   States she started birth control that year  She has the Nexplanon since 2023 at [facility] in [place]   Periods are irregular   Anxiety medication in the past   Working at a [employer] in the past   In a relationship   Smokes marijuana some days  No alcohol    Discussed the use of AI scribe software for clinical note transcription with the patient, who gave verbal consent to proceed.  History of Present Illness Yvette Kerr is a 18 year old female who presents to establish care.  Unintentional weight loss and nutritional status - Unintentional weight loss of approximately 30 pounds in 2023 and has not been able to gain weight since. She has not lost any additional weight since then either.  - Current BMI is 17.77 - Diet consists of two meals per day with a snack - Perceives inadequate protein intake - Dislikes protein shakes  Anxiety and mood disturbances - Ongoing anxiety managed with deep breathing and outdoor activity - No current use of anxiety medication; prefers non-pharmacologic management - Hand tremors when upset, a new symptom - History of depression, particularly during senior year of college - Recent early morning awakenings around 3 AM, attributed to anxiety or mild depression - Fatigue with fluctuating energy levels  Menstrual irregularities and contraceptive use - Very irregular menstrual periods, with some months missed entirely - Nexplanon implant in place since 2023  Cold intolerance and  neurological symptoms - Frequent cold intolerance, even in hot weather - Palpitations - Occasional tingling in feet  Sleep disturbance - Early morning awakenings around 3 AM  Gynecologic infections - History of yeast infections and bacterial vaginosis - No current symptoms of infection  Laboratory findings - Recent labs in May 2025 showed normal hemoglobin and iron levels - Low normal TSH  Social and occupational functioning - In a relationship and feels safe - Occasionally smokes marijuana  - No alcohol use - Left college due to increased anxiety; 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   No facility-administered encounter medications on file as of 11/29/2023.    Past Medical History:  Diagnosis Date   Allergy 8 years ago   im pretty sure that started from mold exposure   Anemia ive always been that way   Anxiety during covid is when it really set in   Asthma a very long time ago   havent had problems with it for years   Depression 2023   on and off i try my hardest though   GERD (gastroesophageal reflux disease) always been that way since a baby   Osteoporosis 2 years ago   i would say i do bevause of the low weight I've been at for two years.   Substance abuse (HCC)    weed    History reviewed. No pertinent surgical  history.  Family History  Problem Relation Age of Onset   Anxiety disorder Mother    Depression Mother    Diabetes Father    Depression Maternal Grandmother    Asthma Brother     Social History   Socioeconomic History   Marital status: Single    Spouse name: Not on file   Number of children: Not on file   Years of education: Not on file   Highest education level: Some college, no degree  Occupational History   Not on file  Tobacco Use   Smoking status: Some Days    Types: E-cigarettes    Passive exposure: Yes   Smokeless tobacco: Not on  file  Vaping Use   Vaping status: Every Day  Substance and Sexual Activity   Alcohol use: Not Currently   Drug use: Yes    Frequency: 1.0 times per week    Types: Marijuana   Sexual activity: Yes    Birth control/protection: Implant    Comment: i want ro chage it though bit idk yet  Other Topics Concern   Not on file  Social History Narrative   Not on file   Social Drivers of Health   Financial Resource Strain: Medium Risk (11/25/2023)   Overall Financial Resource Strain (CARDIA)    Difficulty of Paying Living Expenses: Somewhat hard  Food Insecurity: Patient Declined (11/25/2023)   Hunger Vital Sign    Worried About Running Out of Food in the Last Year: Patient declined    Ran Out of Food in the Last Year: Patient declined  Transportation Needs: Unmet Transportation Needs (11/25/2023)   PRAPARE - Transportation    Lack of Transportation (Medical): No    Lack of Transportation (Non-Medical): Yes  Physical Activity: Insufficiently Active (11/25/2023)   Exercise Vital Sign    Days of Exercise per Week: 4 days    Minutes of Exercise per Session: 20 min  Stress: Stress Concern Present (11/25/2023)   [employer] of Occupational Health - Occupational Stress Questionnaire    Feeling of Stress: To some extent  Social Connections: Unknown (11/25/2023)   Social Connection and Isolation Panel    Frequency of Communication with Friends and Family: Never    Frequency of Social Gatherings with Friends and Family: Twice a week    Attends Religious Services: Patient declined    Database administrator or Organizations: No    Attends Engineer, structural: Not on file    Marital Status: Patient declined  Intimate Partner Violence: Unknown (07/24/2021)   Received from [facility]   HITS    Physically Hurt: Not on file    Insult or Talk Down To: Not on file    Threaten Physical Harm: Not on file    Scream or Curse: Not on file    Review of Systems  Constitutional:  Positive for  malaise/fatigue. Negative for chills, fever and weight loss.  Respiratory:  Negative for shortness of breath.   Cardiovascular:  Positive for palpitations. Negative for chest pain and leg swelling.  Gastrointestinal:  Negative for abdominal pain, constipation, diarrhea, nausea and vomiting.  Genitourinary:  Negative for dysuria, frequency and urgency.  Neurological:  Positive for tingling and tremors. Negative for dizziness, focal weakness and headaches.  Psychiatric/Behavioral:  Negative for depression, substance abuse and suicidal ideas. The patient is nervous/anxious. The patient does not have insomnia.         Objective    BP 104/72 (BP Location: Left Arm, Patient Position: Sitting)  Pulse 70   Temp 97.8 F (36.6 C) (Temporal)   Ht 5' (1.524 m)   Wt 91 lb (41.3 kg)   SpO2 99%   BMI 17.77 kg/m   Physical Exam Constitutional:      General: She is not in acute distress.    Appearance: She is not ill-appearing.  HENT:     Mouth/Throat:     Mouth: Mucous membranes are moist.     Pharynx: Oropharynx is clear.  Eyes:     Extraocular Movements: Extraocular movements intact.     Conjunctiva/sclera: Conjunctivae normal.     Pupils: Pupils are equal, round, and reactive to light.  Cardiovascular:     Rate and Rhythm: Normal rate and regular rhythm.     Heart sounds: Normal heart sounds.  Pulmonary:     Effort: Pulmonary effort is normal.     Breath sounds: Normal breath sounds.  Musculoskeletal:        General: Normal range of motion.     Cervical back: Normal range of motion and neck supple. No tenderness.     Right lower leg: No edema.     Left lower leg: No edema.  Lymphadenopathy:     Cervical: No cervical adenopathy.  Skin:    General: Skin is warm and dry.  Neurological:     General: No focal deficit present.     Mental Status: She is alert and oriented to person, place, and time.     Motor: No weakness.     Coordination: Coordination normal.     Gait: Gait  normal.  Psychiatric:        Mood and Affect: Mood normal.        Behavior: Behavior normal.        Thought Content: Thought content normal.         Assessment & Plan:   Problem List Items Addressed This Visit     Anxiety and depression   Relevant Orders   Ambulatory referral to Psychology   Family history of thyroid disorder   History of anemia   Relevant Orders   CBC with Differential/Platelet   Ferritin   Folate   Vitamin B12   Irregular periods   Relevant Orders   Ambulatory referral to Obstetrics / Gynecology   CBC with Differential/Platelet   Comprehensive metabolic panel with GFR   TSH   T4, free   hCG, serum, qualitative   Underweight (BMI < 18.5) - Primary   Relevant Orders   Ambulatory referral to Obstetrics / Gynecology   CBC with Differential/Platelet   TSH   T4, free   Hemoglobin A1c   Prealbumin   Other Visit Diagnoses       Encounter to establish care         Fatigue, unspecified type       Relevant Orders   CBC with Differential/Platelet   Comprehensive metabolic panel with GFR   Ferritin   Folate   TSH   T4, free   Magnesium     Paresthesias       Relevant Orders   CBC with Differential/Platelet   Comprehensive metabolic panel with GFR   Ferritin   Folate   Vitamin B12   TSH   T4, free   Magnesium     Sensation of feeling cold       Relevant Orders   TSH   T4, free   Magnesium     Encounter for screening for other viral diseases  Relevant Orders   Hepatitis C antibody   HIV Antibody (routine testing w rflx)      Assessment and Plan Assessment & Plan Establish care visit  Establishing care with a history of anemia with normal labs in May 2025 and a family history of thyroid disorder. Current BMI is 17.77, indicating underweight status. - Order blood work to check thyroid levels and other relevant labs. - Encourage increased caloric intake and protein consumption. - Refer to OB GYN for irregular menstrual cycles. -  Refer to therapist Rolin Pouch for anxiety and depression management.  Unintentional weight loss with low BMI Significant weight loss of approximately 30 pounds at the end of 2023 with current inability to regain weight. BMI is 17.77, indicating underweight status. - Encourage increased caloric intake and protein consumption. - Order blood work to check thyroid levels and other relevant labs.  Anxiety and depression Anxiety and depression with previous use of mirtazapine  (Remeron ) for anxiety and appetite. Currently not on medication. Symptoms include trembling hands and early morning awakenings. Interested in therapy but not medication at this time. - Refer to therapist Rolin Pouch for anxiety and depression management.  Palpitations and fatigue Reports palpitations and fatigue for about two years, often associated with stress or anxiety. Fatigue is variable with fluctuating energy levels. - Order blood work to check thyroid levels and other relevant labs.  Tremor of hands Reports hand tremors, particularly when upset or anxious, possibly related to anxiety. - Refer to therapist Rolin Pouch for anxiety management.  Irregular menstrual cycles Irregular menstrual cycles since starting Nexplanon in 2023, with some months missed. History of infections such as bacterial vaginosis and yeast infections, currently resolved. - Refer to OB GYN for evaluation of irregular menstrual cycles.    Return in about 6 weeks (around 01/10/2024) for chronic health conditions.   Boby Mackintosh, NP-C

## 2023-11-29 NOTE — Patient Instructions (Addendum)
 Please go downstairs for labs before you leave.   Schedule your appointment with the therapist, Rolin Pouch, downstairs before you leave if possible.   I referred you to an OB/GYN office. They will call you to schedule.   I will be in touch with your lab results.    Increase your calories and protein intake.    Follow up here in 6 weeks.

## 2023-11-30 LAB — HCG, SERUM, QUALITATIVE: Preg, Serum: NEGATIVE

## 2023-12-01 LAB — MAGNESIUM: Magnesium: 2.1 mg/dL (ref 1.5–2.5)

## 2023-12-04 ENCOUNTER — Ambulatory Visit: Payer: Self-pay | Admitting: Family Medicine

## 2023-12-05 LAB — HIV ANTIBODY (ROUTINE TESTING W REFLEX): HIV 1&2 Ab, 4th Generation: NONREACTIVE

## 2023-12-05 LAB — HEPATITIS C ANTIBODY: Hepatitis C Ab: NONREACTIVE

## 2023-12-05 LAB — PREALBUMIN: Prealbumin: 22 mg/dL (ref 17–34)

## 2023-12-16 ENCOUNTER — Ambulatory Visit: Admitting: Family Medicine

## 2024-01-10 ENCOUNTER — Encounter: Payer: Self-pay | Admitting: Family Medicine

## 2024-01-10 ENCOUNTER — Ambulatory Visit (INDEPENDENT_AMBULATORY_CARE_PROVIDER_SITE_OTHER): Admitting: Family Medicine

## 2024-01-10 ENCOUNTER — Ambulatory Visit: Admitting: Family Medicine

## 2024-01-10 VITALS — BP 96/64 | HR 72 | Temp 97.8°F | Ht 60.0 in | Wt 90.0 lb

## 2024-01-10 DIAGNOSIS — F419 Anxiety disorder, unspecified: Secondary | ICD-10-CM | POA: Diagnosis not present

## 2024-01-10 DIAGNOSIS — F32A Depression, unspecified: Secondary | ICD-10-CM

## 2024-01-10 DIAGNOSIS — R519 Headache, unspecified: Secondary | ICD-10-CM | POA: Diagnosis not present

## 2024-01-10 DIAGNOSIS — N912 Amenorrhea, unspecified: Secondary | ICD-10-CM

## 2024-01-10 LAB — POCT URINE PREGNANCY: Preg Test, Ur: NEGATIVE

## 2024-01-10 NOTE — Patient Instructions (Addendum)
  Keep a headache journal of symptoms, what the headache feels like and how long it lasts.   Try taking magnesium over the counter 400 mg daily   Stay hydrated, avoid skipping meals, and get regular sleep   Continue taking your prenatal vitamin   Try taking over the counter Ibuprofen  800 mg or Excedrin migraine at onset of symptoms but avoid taking these more than 2 days per week.      Please call to schedule:  [facility] [address], [place], [place] [address] [phone]   Please call to schedule with counselor Rolin Pouch  [phone]

## 2024-01-10 NOTE — Progress Notes (Signed)
 Subjective:     Patient ID: Yvette Kerr, female    DOB: 2005-09-04, 18 y.o.   MRN: [id number]  Chief Complaint  Patient presents with   Medical Management of Chronic Issues    6 week f/u, has been having bad migraines recently. Gets them every other day, before starting otc Excedrin they were lasting for a week. Has been going on for at least 3 weeks now.    HPI  Discussed the use of AI scribe software for clinical note transcription with the patient, who gave verbal consent to proceed.  History of Present Illness Yvette Kerr is a 18 year old female who presents for a six-week follow-up on chronic health conditions.  Cephalalgia and associated neurological symptoms - Migraines for the past two and a half weeks, more severe than previous headaches - Pain begins at the frontal region of the head - Associated photophobia - Occasional blurred vision - No nausea or vomiting - Fluctuating appetite - Excedrin Migraine provides relief  Menstrual irregularity and contraceptive use - Menstrual periods remain irregular - No menstrual period since before last appointment over a month ago - Nexplanon implant for contraception, placed September 2023 - Pregnancy test negative at last visit  Laboratory findings and nutritional status - Recent blood work shows low normal vitamin B12 levels - Takes prenatal vitamins - Thyroid function low normal - No anemia present  Weight stability - Weight stable over the past six weeks     Health Maintenance Due  Topic Date Due   Pneumococcal Vaccine (1 of 1 - PPSV23, PCV20, or PCV21) 10/06/2009   HPV VACCINES (2 - 2-dose series) 06/16/2016   Meningococcal B Vaccine (1 of 2 - Standard) Never done    Past Medical History:  Diagnosis Date   Allergy 8 years ago   im pretty sure that started from mold exposure   Anemia ive always been that way   Anxiety during covid is when it really set in   Asthma a very long time ago   havent had  problems with it for years   Depression 2023   on and off i try my hardest though   GERD (gastroesophageal reflux disease) always been that way since a baby   Osteoporosis 2 years ago   i would say i do bevause of the low weight I've been at for two years.   Substance abuse (HCC)    weed    History reviewed. No pertinent surgical history.  Family History  Problem Relation Age of Onset   Anxiety disorder Mother    Depression Mother    Diabetes Father    Depression Maternal Grandmother    Asthma Brother     Social History   Socioeconomic History   Marital status: Single    Spouse name: Not on file   Number of children: Not on file   Years of education: Not on file   Highest education level: Some college, no degree  Occupational History   Not on file  Tobacco Use   Smoking status: Some Days    Types: E-cigarettes    Passive exposure: Yes   Smokeless tobacco: Not on file  Vaping Use   Vaping status: Every Day  Substance and Sexual Activity   Alcohol use: Not Currently   Drug use: Yes    Frequency: 1.0 times per week    Types: Marijuana   Sexual activity: Yes    Birth control/protection: Implant    Comment: i want ro  chage it though bit idk yet  Other Topics Concern   Not on file  Social History Narrative   Not on file   Social Drivers of Health   Financial Resource Strain: Medium Risk (11/25/2023)   Overall Financial Resource Strain (CARDIA)    Difficulty of Paying Living Expenses: Somewhat hard  Food Insecurity: Patient Declined (11/25/2023)   Hunger Vital Sign    Worried About Running Out of Food in the Last Year: Patient declined    Ran Out of Food in the Last Year: Patient declined  Transportation Needs: Unmet Transportation Needs (11/25/2023)   PRAPARE - Administrator, Civil Service (Medical): No    Lack of Transportation (Non-Medical): Yes  Physical Activity: Insufficiently Active (11/25/2023)   Exercise Vital Sign    Days of Exercise per Week:  4 days    Minutes of Exercise per Session: 20 min  Stress: Stress Concern Present (11/25/2023)   [employer] of Occupational Health - Occupational Stress Questionnaire    Feeling of Stress: To some extent  Social Connections: Unknown (11/25/2023)   Social Connection and Isolation Panel    Frequency of Communication with Friends and Family: Never    Frequency of Social Gatherings with Friends and Family: Twice a week    Attends Religious Services: Patient declined    Database administrator or Organizations: No    Attends Engineer, structural: Not on file    Marital Status: Patient declined  Intimate Partner Violence: Unknown (07/24/2021)   Received from [facility]   HITS    Physically Hurt: Not on file    Insult or Talk Down To: Not on file    Threaten Physical Harm: Not on file    Scream or Curse: Not on file    No outpatient medications prior to visit.   No facility-administered medications prior to visit.    No Known Allergies  ROS Per HPI    Objective:    Physical Exam Constitutional:      General: She is not in acute distress.    Appearance: She is not ill-appearing.  Eyes:     Extraocular Movements: Extraocular movements intact.     Conjunctiva/sclera: Conjunctivae normal.  Cardiovascular:     Rate and Rhythm: Normal rate.  Pulmonary:     Effort: Pulmonary effort is normal.  Musculoskeletal:     Cervical back: Normal range of motion and neck supple.  Skin:    General: Skin is warm and dry.  Neurological:     General: No focal deficit present.     Mental Status: She is alert and oriented to person, place, and time.  Psychiatric:        Mood and Affect: Mood normal.        Behavior: Behavior normal.        Thought Content: Thought content normal.      BP 96/64   Pulse 72   Temp 97.8 F (36.6 C) (Temporal)   Ht 5' (1.524 m)   Wt 90 lb (40.8 kg)   SpO2 98%   BMI 17.58 kg/m  Wt Readings from Last 3 Encounters:  01/10/24 90 lb (40.8 kg)   11/29/23 91 lb (41.3 kg)  09/06/23 87 lb 6.4 oz (39.6 kg) (<1%, Z= -3.13)*   * Growth percentiles are based on CDC (Girls, 2-20 Years) data.       Assessment & Plan:   Problem List Items Addressed This Visit     Anxiety and depression -  Primary   Other Visit Diagnoses       Amenorrhea       Relevant Orders   POCT urine pregnancy (Completed)     Increased frequency of headaches           Assessment and Plan Assessment & Plan Migraine headaches New onset of migraine headaches for the past two and a half weeks with symptoms of light sensitivity and occasional blurred vision. No associated nausea or vomiting.  Her mother also experiences migraines. Discussed potential triggers such as dehydration, lack of sleep, and dietary factors. Emphasized the importance of identifying triggers and preventing headaches rather than relying on medication. - Advise maintaining hydration and regular sleep schedule. - Recommend keeping a headache diary to identify potential triggers. - Advise use of Excedrin Migraine as needed, cautioning against daily use to prevent rebound headaches. - Recommend over-the-counter magnesium 400 mg daily to help prevent migraines.  Menstrual irregularities with contraceptive use (Nexplanon) Amenorrhea since before the last appointment, possibly related to Nexplanon use. Last menstrual period was over a month ago. Pregnancy test previously negative.  - Perform urine pregnancy test to rule out pregnancy, negative today  - Advise contacting gynecology for further evaluation and discussion of contraceptive options.  Anxiety and mood disturbances Referral to therapy was made, but no appointment has been scheduled yet. - Ensure referral to therapist Rolin Pouch is followed up and provide contact information for scheduling.  Unintentional weight loss and nutritional status Weight is well-managed over the past six weeks. Blood work shows low normal vitamin B12 and  thyroid function within normal range. She is taking prenatal vitamins, which is beneficial for her nutritional status. - Continue prenatal vitamins.     Jahniyah Kassa does not currently have medications on file.  No orders of the defined types were placed in this encounter.

## 2024-01-26 ENCOUNTER — Ambulatory Visit: Admitting: Clinical

## 2024-01-26 DIAGNOSIS — F4323 Adjustment disorder with mixed anxiety and depressed mood: Secondary | ICD-10-CM
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
## 2024-02-06 ENCOUNTER — Ambulatory Visit: Admitting: Emergency Medicine

## 2024-02-06 ENCOUNTER — Ambulatory Visit: Payer: Self-pay

## 2024-02-06 ENCOUNTER — Encounter: Payer: Self-pay | Admitting: Family Medicine

## 2024-02-06 VITALS — BP 102/78 | HR 74 | Temp 98.6°F | Ht 60.0 in | Wt 84.4 lb

## 2024-02-06 DIAGNOSIS — N390 Urinary tract infection, site not specified: Secondary | ICD-10-CM | POA: Diagnosis not present

## 2024-02-06 DIAGNOSIS — R300 Dysuria: Secondary | ICD-10-CM

## 2024-02-06 LAB — POCT URINALYSIS DIP (CLINITEK)
Bilirubin, UA: NEGATIVE
Blood, UA: NEGATIVE
Glucose, UA: NEGATIVE mg/dL
Leukocytes, UA: NEGATIVE
Nitrite, UA: NEGATIVE
POC PROTEIN,UA: NEGATIVE
Spec Grav, UA: 1.02 (ref 1.010–1.025)
Urobilinogen, UA: 0.2 U/dL
pH, UA: 6 (ref 5.0–8.0)

## 2024-02-06 MED ORDER — CEFUROXIME AXETIL 250 MG PO TABS: 250.0000 mg | ORAL_TABLET | Freq: Two times a day (BID) | ORAL | 0 refills | Status: AC

## 2024-02-06 NOTE — Progress Notes (Signed)
 Yvette Kerr 18 y.o.   Chief Complaint  Patient presents with   Urinary Tract Infection    Symptoms: Urinary frequency, could not sleep last night.     HISTORY OF PRESENT ILLNESS: This is a 18 y.o. female complaining of burning on urination and frequency for the last couple of days worse the last 24 hours Denies fever or chills.  Able to eat and drink.  Denies nausea or vomiting.  Denies flank or abdominal pain.  Denies gross hematuria. No other complaints or medical concerns today.  Urinary Tract Infection  Associated symptoms include frequency. Pertinent negatives include no chills, nausea or vomiting.     Prior to Admission medications   Not on File    No Known Allergies  Patient Active Problem List   Diagnosis Date Noted   Underweight (BMI < 18.5) 11/29/2023   History of anemia 11/29/2023   Family history of thyroid disorder 11/29/2023   Anxiety and depression 11/29/2023   Irregular periods 11/29/2023    Past Medical History:  Diagnosis Date   Allergy 8 years ago   im pretty sure that started from mold exposure   Anemia ive always been that way   Anxiety during covid is when it really set in   Asthma a very long time ago   havent had problems with it for years   Depression 2023   on and off i try my hardest though   GERD (gastroesophageal reflux disease) always been that way since a baby   Osteoporosis 2 years ago   i would say i do bevause of the low weight I've been at for two years.   Substance abuse (HCC)    weed    No past surgical history on file.  Social History   Socioeconomic History   Marital status: Single    Spouse name: Not on file   Number of children: Not on file   Years of education: Not on file   Highest education level: Some college, no degree  Occupational History   Not on file  Tobacco Use   Smoking status: Some Days    Types: E-cigarettes    Passive exposure: Yes   Smokeless tobacco: Not on file  Vaping Use   Vaping  status: Every Day  Substance and Sexual Activity   Alcohol use: Not Currently   Drug use: Yes    Frequency: 1.0 times per week    Types: Marijuana   Sexual activity: Yes    Birth control/protection: Implant    Comment: i want ro chage it though bit idk yet  Other Topics Concern   Not on file  Social History Narrative   Not on file   Social Drivers of Health   Financial Resource Strain: Medium Risk (02/06/2024)   Overall Financial Resource Strain (CARDIA)    Difficulty of Paying Living Expenses: Somewhat hard  Food Insecurity: No Food Insecurity (02/06/2024)   Hunger Vital Sign    Worried About Running Out of Food in the Last Year: Never true    Ran Out of Food in the Last Year: Never true  Transportation Needs: No Transportation Needs (02/06/2024)   PRAPARE - Administrator, Civil Service (Medical): No    Lack of Transportation (Non-Medical): No  Recent Concern: Transportation Needs - Unmet Transportation Needs (11/25/2023)   PRAPARE - Transportation    Lack of Transportation (Medical): No    Lack of Transportation (Non-Medical): Yes  Physical Activity: Insufficiently Active (02/06/2024)   Exercise  Vital Sign    Days of Exercise per Week: 5 days    Minutes of Exercise per Session: 20 min  Stress: Patient Declined (02/06/2024)   [employer] of Occupational Health - Occupational Stress Questionnaire    Feeling of Stress: Patient declined  Recent Concern: Stress - Stress Concern Present (11/25/2023)   [employer] of Occupational Health - Occupational Stress Questionnaire    Feeling of Stress: To some extent  Social Connections: Unknown (02/06/2024)   Social Connection and Isolation Panel    Frequency of Communication with Friends and Family: Patient declined    Frequency of Social Gatherings with Friends and Family: Patient declined    Attends Religious Services: Patient declined    Database administrator or Organizations: Patient declined    Attends  Banker Meetings: Not on file    Marital Status: Patient declined  Intimate Partner Violence: Unknown (07/24/2021)   Received from [facility]   HITS    Physically Hurt: Not on file    Insult or Talk Down To: Not on file    Threaten Physical Harm: Not on file    Scream or Curse: Not on file    Family History  Problem Relation Age of Onset   Anxiety disorder Mother    Depression Mother    Diabetes Father    Depression Maternal Grandmother    Asthma Brother      Review of Systems  Constitutional:  Negative for chills and fever.  HENT:  Negative for congestion and sore throat.   Respiratory:  Negative for cough and shortness of breath.   Cardiovascular:  Negative for chest pain.  Gastrointestinal:  Negative for nausea and vomiting.  Genitourinary:  Positive for dysuria and frequency.  Skin: Negative.  Negative for rash.  All other systems reviewed and are negative.   Today's Vitals   02/06/24 1622  BP: 102/78  Pulse: 74  Temp: 98.6 F (37 C)  TempSrc: Oral  SpO2: 97%  Weight: 84 lb 6.4 oz (38.3 kg)  Height: 5' (1.524 m)   Body mass index is 16.48 kg/m.   Physical Exam Vitals reviewed.  Constitutional:      Appearance: Normal appearance.  HENT:     Head: Normocephalic.  Eyes:     Extraocular Movements: Extraocular movements intact.  Cardiovascular:     Rate and Rhythm: Normal rate.  Pulmonary:     Effort: Pulmonary effort is normal.  Abdominal:     Palpations: Abdomen is soft.     Tenderness: There is no abdominal tenderness.  Skin:    General: Skin is warm and dry.  Neurological:     Mental Status: She is alert and oriented to person, place, and time.  Psychiatric:        Mood and Affect: Mood normal.        Behavior: Behavior normal.    Results for orders placed or performed in visit on 02/06/24 (from the past 24 hours)  POCT URINALYSIS DIP (CLINITEK)     Status: Abnormal   Collection Time: 02/06/24  4:33 PM  Result Value Ref Range    Color, UA yellow yellow   Clarity, UA clear clear   Glucose, UA negative negative mg/dL   Bilirubin, UA negative negative   Ketones, POC UA trace (5) (A) negative mg/dL   Spec Grav, UA 8.979 8.989 - 1.025   Blood, UA negative negative   pH, UA 6.0 5.0 - 8.0   POC PROTEIN,UA negative negative, trace  Urobilinogen, UA 0.2 0.2 or 1.0 E.U./dL   Nitrite, UA Negative Negative   Leukocytes, UA Negative Negative     ASSESSMENT & PLAN: Problem List Items Addressed This Visit       Genitourinary   Acute UTI - Primary   Clinically stable.  No red flag signs or symptoms. Afebrile.  Unremarkable exam. No signs of pyelonephritis. Advised to rest and stay well-hydrated Unremarkable urinalysis.  Will send for culture. Recommend to start Ceftin 250 mg twice a day for 7 days. Advised to contact the office if no better or worse during the next several days.      Relevant Medications   cefUROXime (CEFTIN) 250 MG tablet     Other   Dysuria   Symptom management discussed Recommend over-the-counter Azo Tylenol and or Advil  as needed Advised to stay well-hydrated      Relevant Orders   Urine Culture   POCT URINALYSIS DIP (CLINITEK) (Completed)     Patient Instructions  Urinary Tract Infection, Female A urinary tract infection (UTI) is an infection in your urinary tract. The urinary tract is made up of organs that make, store, and get rid of pee (urine) in your body. These organs include: The kidneys. The ureters. The bladder. The urethra. What are the causes? Most UTIs are caused by germs called bacteria. They may be in or near your genitals. These germs grow and cause swelling in your urinary tract. What increases the risk? You're more likely to get a UTI if: You're a female. The urethra is shorter in females than in males. You have a soft tube called a catheter that drains your pee. You can't control when you pee or poop. You have trouble peeing because of: A kidney  stone. A urinary blockage. A nerve condition that affects your bladder. Not getting enough to drink. You're sexually active. You use a birth control inside your vagina, like spermicide. You're pregnant. You have low levels of the hormone estrogen in your body. You're an older adult. You're also more likely to get a UTI if you have other health problems. These may include: Diabetes. A weak immune system. Your immune system is your body's defense system. Sickle cell disease. Injury of the spine. What are the signs or symptoms? Symptoms may include: Needing to pee right away. Peeing small amounts often. Pain or burning when you pee. Blood in your pee. Pee that smells bad or odd. Pain in your belly or lower back. You may also: Feel confused. This may be the first symptom in older adults. Vomit. Not feel hungry. Feel tired or easily annoyed. Have a fever or chills. How is this diagnosed? A UTI is diagnosed based on your medical history and an exam. You may also have other tests. These may include: Pee tests. Blood tests. Tests for sexually transmitted infections (STIs). If you've had more than one UTI, you may need to have imaging studies done to find out why you keep getting them. How is this treated? A UTI can be treated by: Taking antibiotics or other medicines. Drinking enough fluid to keep your pee pale yellow. In rare cases, a UTI can cause a very bad condition called sepsis. Sepsis may be treated in the hospital. Follow these instructions at home: Medicines Take your medicines only as told by your health care provider. If you were given antibiotics, take them as told by your provider. Do not stop taking them even if you start to feel better. General instructions Make sure you: [facility]  often and fully. Do not hold your pee for a long time. Wipe from front to back after you pee or poop. Use each tissue only once when you wipe. Pee after you have sex. Do not douche or use  sprays or powders in your genital area. Contact a health care provider if: Your symptoms don't get better after 1-2 days of taking antibiotics. Your symptoms go away and then come back. You have a fever or chills. You vomit or feel like you may vomit. Get help right away if: You have very bad pain in your back or lower belly. You faint. This information is not intended to replace advice given to you by your health care provider. Make sure you discuss any questions you have with your health care provider. Document Revised: 03/16/2023 Document Reviewed: 07/09/2022 Elsevier Patient Education  2025 Elsevier Inc.    Emil Schaumann, MD [facility]

## 2024-02-06 NOTE — Patient Instructions (Signed)

## 2024-02-06 NOTE — Assessment & Plan Note (Signed)
 Clinically stable.  No red flag signs or symptoms. Afebrile.  Unremarkable exam. No signs of pyelonephritis. Advised to rest and stay well-hydrated Unremarkable urinalysis.  Will send for culture. Recommend to start Ceftin 250 mg twice a day for 7 days. Advised to contact the office if no better or worse during the next several days.

## 2024-02-06 NOTE — Assessment & Plan Note (Signed)
 Symptom management discussed Recommend over-the-counter Azo Tylenol and or Advil  as needed Advised to stay well-hydrated

## 2024-02-06 NOTE — Telephone Encounter (Signed)
 FYI Only or Action Required?: FYI only for provider.  Patient was last seen in primary care on 01/10/2024 by Yvette Boby CROME, NP-C.  Called Nurse Triage reporting Dysuria.  Symptoms began yesterday.  Interventions attempted: Nothing.  Symptoms are: gradually worsening.  Triage Disposition: See Physician Within 24 Hours  Patient/caregiver understands and will follow disposition?: Yes     Copied from CRM #[id number]. Topic: Clinical - Red Word Triage >> Feb 06, 2024 10:20 AM Yvette Kerr wrote: Red Word that prompted transfer to Nurse Triage: believes she has uti, pressure and back pain      Reason for Disposition  All other patients with painful urination  (Exception: [1] EITHER frequency or urgency AND [2] has on-call doctor.)  Answer Assessment - Initial Assessment Questions 1. SEVERITY: How bad is the pain?  (e.g., Scale 1-10; mild, moderate, or severe)     6/10 2. FREQUENCY: How many times have you had painful urination today?      10 times  3. PATTERN: Is pain present every time you urinate or just sometimes?      Every urination  4. ONSET: When did the painful urination start?      Yesterday  5. FEVER: Do you have a fever? If Yes, ask: What is your temperature, how was it measured, and when did it start?     No 6. PAST UTI: Have you had a urine infection before? If Yes, ask: When was the last time? and What happened that time?      Yes, similar  7. CAUSE: What do you think is causing the painful urination?  (e.g., UTI, scratch, Herpes sore)     UTI 8. OTHER SYMPTOMS: Do you have any other symptoms? (e.g., blood in urine, flank pain, genital sores, urgency, vaginal discharge)     Lower abdominal pressure and lower back pain  9. PREGNANCY: Is there any chance you are pregnant? When was your last menstrual period?     No  Protocols used: Urination Pain - Female-A-AH

## 2024-02-07 ENCOUNTER — Ambulatory Visit: Payer: Self-pay | Admitting: Emergency Medicine

## 2024-02-07 LAB — URINE CULTURE

## 2024-02-07 NOTE — Telephone Encounter (Signed)
 Normal urinalysis.  Clinically she has an infection so we have to wait on urine culture to come back before we can say that there is no infection.

## 2024-02-08 NOTE — Telephone Encounter (Signed)
 Negative urine culture.  If she is that uncomfortable, recommend she goes to urgent care center and also needs to follow-up with her PCP.  Thank you.

## 2024-02-22 ENCOUNTER — Telehealth: Payer: Self-pay | Admitting: Clinical

## 2024-02-22 ENCOUNTER — Ambulatory Visit: Admitting: Clinical

## 2024-02-22 NOTE — Telephone Encounter (Signed)
 TC to [name] regarding today's appointment. No answer. This Clinician left a message to call back with name & contact information.

## 2024-02-22 NOTE — Progress Notes (Unsigned)
 [facility] Counselor/Therapist Progress Note - IN-PERSON  Patient ID: Valincia Touch, MRN: [id number]    Date: 02/22/2024  Time Spent: ***   - ***  : *** Minutes  Types of Service: Individual psychotherapy  Presenting Concerns:  ***  Mental Status Exam: Appearance:  {PSY:22683}     Behavior: {PSY:21022743}  Motor: {PSY:22302}  Speech/Language:  {PSY:22685}  Affect: {PSY:22687}  Mood: {PSY:31886}  Thought process: {PSY:31888}  Thought content:   {[phone]}  Sensory/Perceptual disturbances:   {[phone]}  Orientation: {PSY:30297}  Attention: {PSY:22877}  Concentration: {[phone]}  Memory: {[phone]}  Fund of knowledge:  {[phone]}  Insight:   {[phone]}  Judgment:  {[phone]}  Impulse Control: {[phone]}   Risk Assessment: Danger to Self:  {PSY:22692} Self-injurious Behavior: {PSY:22692} Danger to Others: {PSY:22692} Duty to Warn:{PSY:311194}   Subjective:  ***   Interventions: {PSY:223-382-1377}  Client Response: ***   Diagnosis:  No diagnosis found.   Goals, Assessment & Plan:   *** Frequency: ***  Modality: ***    Goal: ***  Target Date: ***  Progress: ***     Goal: ***  Target Date: ***  Progress: ***    Tequilla Cousineau P. Trudy, MSW, LCSW [employer] Therapist Main Office: [phone]

## 2024-02-23 ENCOUNTER — Encounter: Payer: Self-pay | Admitting: Clinical

## 2024-03-01 ENCOUNTER — Ambulatory Visit (INDEPENDENT_AMBULATORY_CARE_PROVIDER_SITE_OTHER): Admitting: Clinical

## 2024-03-01 DIAGNOSIS — F4323 Adjustment disorder with mixed anxiety and depressed mood: Secondary | ICD-10-CM | POA: Diagnosis not present

## 2024-03-01 NOTE — Progress Notes (Signed)
 [facility] Counselor/Therapist Progress Note - IN-PERSON  Patient ID: Yvette Kerr, MRN: [id number]    Date: 03/01/24  Time Spent: 2:18pm   - 3:04pm  46 Minutes  Types of Service: Individual psychotherapy  Presenting Concerns:  Increased anxiety and wants to manage it  Mental Status Exam: Appearance:  Casual and Neat     Behavior: Appropriate  Motor: Normal  Speech/Language:  Normal Rate  Affect: Appropriate  Mood: anxious  Thought process: normal  Thought content:   WNL  Sensory/Perceptual disturbances:   WNL  Orientation: oriented to person, place, time/date, situation, and day of week  Attention: Good  Concentration: Good  Memory: WNL  Fund of knowledge:  Good  Insight:   Good  Judgment:  Good  Impulse Control: Good   Risk Assessment: Danger to Self:  No Self-injurious Behavior: No Danger to Others: No Duty to Warn:no   Subjective:  Yvette Kerr presented to be frustrated since she went upstairs to her primary care's office and was informed she didn't have an appointment. This Clinician's office in on the first floor in the Sports Medicine office.  Yvette Kerr also reported anxiety with other situations and sometimes she doesn't know why.   Interventions: Developed Treatment Plan focusing on managing anxiety symptoms. Cognitive Behavioral Therapy and Psycho-education/Bibliotherapy- provided written info on Mindfulness.  Client Response: Yvette Kerr was able to verbalize her thoughts & feelings with today's situation.  She actively participated in a mindfulness walk to calm her mind & body.  Yvette Kerr developed treatment goals and identified situations that increase her anxiety or situations that she's not sure why it's making her feel anxious, eg. 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  Diagnosis:  Adjustment disorder with mixed anxiety and depressed mood   Goals, Assessment & Plan:   Yvette Kerr  presents with moderate symptoms of anxiety and would benefit from enhancing her coping skills to manage her symptoms .  Frequency: 2 -3 times a month  Modality: In Person or Telemedicine    Yvette Kerr, MSW, LCSW [employer] Therapist Main Office: [phone]

## 2024-03-01 NOTE — Progress Notes (Signed)
 Behavioral Health Treatment Plan   Name:[facility]:  [place] - [id number]  Medicaid [place] - [id number]   MRN: [id number]   Treatment Plan Development Date: 03/01/2024   Strengths: Able to Communicate EffectivelyBeing too aware  aware of herself and others. Good person  emotionally intelligent Hobbies - drawing, doing hair, cooking  Supports: Mother who lives in [place]   Client Statement of Needs: Wants to know why she feels anxious and how to manage it   Treatment Level:Outpatient individual psycho therapy  Client Treatment Preferences: In-Person every other week   Diagnosis: Adjustment Disorder - Adjusting to an increase in anxiety symptoms   Symptoms:  Reported panic attacks, physical symptoms with feeling hot, heart rate changes, and thoughts of being judged  Goals:  Improve coping skills by learning effective ways to adapt and manage anxiety.  Objectives: Target Date For All Objectives: 01/17/2025  Enhance problem-solving abilities and develop skills to address and resolve challenges., Interrupt negative thinking and increase positive thoughts and feelings, and Practice mindfulness  Progress Documentation:   Progressing  Interventions:  Mindfulness Meditation and Psycho-education/Bibliotherapy   Expected duration of treatment: 12 months  Party responsible for implementation of interventions:  This Clinician  This plan has been reviewed and created by the following participants: This Clinician and Kimberlynn Swaim   This plan will be reviewed at least every 12 months.   Signature: Yes, please see patient chart for sign off.    Yanelis Osika P. Trudy, MSW, LCSW [employer] Therapist Main Office: [phone]                 Rolin SHAUNNA Trudy, [place]

## 2024-03-03 ENCOUNTER — Encounter: Payer: Self-pay | Admitting: Clinical

## 2024-03-08 ENCOUNTER — Ambulatory Visit: Admitting: Clinical

## 2024-03-08 NOTE — Progress Notes (Unsigned)
                Yvette Kerr SHAUNNA Pouch, LCSW

## 2024-03-10 ENCOUNTER — Ambulatory Visit
Admission: EM | Admit: 2024-03-10 | Discharge: 2024-03-10 | Disposition: A | Discharge status: Home / Self Care | Attending: Family Medicine | Admitting: Family Medicine

## 2024-03-10 DIAGNOSIS — R300 Dysuria: Secondary | ICD-10-CM | POA: Insufficient documentation

## 2024-03-10 DIAGNOSIS — N3001 Acute cystitis with hematuria: Secondary | ICD-10-CM | POA: Diagnosis present

## 2024-03-10 DIAGNOSIS — N898 Other specified noninflammatory disorders of vagina: Secondary | ICD-10-CM | POA: Insufficient documentation

## 2024-03-10 LAB — POCT URINE DIPSTICK
Glucose, UA: 100 mg/dL — AB
Nitrite, UA: POSITIVE — AB
POC PROTEIN,UA: 100 — AB
Spec Grav, UA: 1.025 (ref 1.010–1.025)
Urobilinogen, UA: 2 U/dL — AB
pH, UA: 5.5 (ref 5.0–8.0)

## 2024-03-10 LAB — POCT URINE PREGNANCY: Preg Test, Ur: NEGATIVE

## 2024-03-10 MED ORDER — CEPHALEXIN 500 MG PO CAPS: 500.0000 mg | ORAL_CAPSULE | Freq: Two times a day (BID) | ORAL | 0 refills | Status: AC

## 2024-03-10 MED ORDER — FLUCONAZOLE 150 MG PO TABS: 150.0000 mg | ORAL_TABLET | ORAL | 0 refills | Status: AC

## 2024-03-10 NOTE — Discharge Instructions (Addendum)
 Please start Keflex  to address an urinary tract infection and fluconazole  to cover for yeast infection. Make sure you hydrate very well with plain water and a quantity of 80 ounces of water a day.  Please limit drinks that are considered urinary irritants such as fruit juices, soda, sweet tea, coffee, artifical sweetened drinks, energy drinks, alcohol.  These can worsen your urinary and genital symptoms but also be the source of them.  I will let you know about your vaginal swab and urine culture results through MyChart to see if we need to prescribe or change your antibiotics based off of those results.

## 2024-03-10 NOTE — ED Provider Notes (Signed)
 [facility]  Note:  This document was prepared using Conservation officer, historic buildings and may include unintentional dictation errors.  MRN: [id number] DOB: 20-Dec-2005  Subjective:   Yvette Kerr is a 18 y.o. female presenting for 1 week history of vaginal irritation that lingers after urinating, dysuria.  Denies fever, n/v, abdominal pain, pelvic pain, rashes, urinary frequency, hematuria, vaginal discharge.  Does not hydrate with water consistently. Avoids drinking urinary irritants consistently. Would like STI testing due to her vaginal irritation. Does not want HIV or RPR testing.   No current facility-administered medications for this encounter. No current outpatient medications on file.   No Known Allergies  Past Medical History:  Diagnosis Date   Allergy 8 years ago   im pretty sure that started from mold exposure   Anemia ive always been that way   Anxiety during covid is when it really set in   Asthma a very long time ago   havent had problems with it for years   Depression 2023   on and off i try my hardest though   GERD (gastroesophageal reflux disease) always been that way since a baby   Osteoporosis 2 years ago   i would say i do bevause of the low weight I've been at for two years.   Substance abuse (HCC)    weed     History reviewed. No pertinent surgical history.  Family History  Problem Relation Age of Onset   Anxiety disorder Mother    Depression Mother    Diabetes Father    Depression Maternal Grandmother    Asthma Brother     Social History   Tobacco Use   Smoking status: Former    Types: E-cigarettes    Passive exposure: Yes  Vaping Use   Vaping status: Former  Substance Use Topics   Alcohol use: Yes    Comment: occ   Drug use: Yes    Types: Marijuana    ROS   Objective:   Vitals: BP 119/82 (BP Location: Right Arm)   Pulse 88   Temp 99.4 F (37.4 C) (Oral)   Resp 16   SpO2 94%   Physical  Exam Constitutional:      General: She is not in acute distress.    Appearance: Normal appearance. She is well-developed. She is not ill-appearing, toxic-appearing or diaphoretic.  HENT:     Head: Normocephalic and atraumatic.     Nose: Nose normal.     Mouth/Throat:     Mouth: Mucous membranes are moist.  Eyes:     General: No scleral icterus.       Right eye: No discharge.        Left eye: No discharge.     Extraocular Movements: Extraocular movements intact.     Conjunctiva/sclera: Conjunctivae normal.  Cardiovascular:     Rate and Rhythm: Normal rate.  Pulmonary:     Effort: Pulmonary effort is normal.  Abdominal:     General: Bowel sounds are normal. There is no distension.     Palpations: Abdomen is soft. There is no mass.     Tenderness: There is no abdominal tenderness. There is no right CVA tenderness, left CVA tenderness, guarding or rebound.  Skin:    General: Skin is warm and dry.  Neurological:     General: No focal deficit present.     Mental Status: She is alert and oriented to person, place, and time.  Psychiatric:  Mood and Affect: Mood normal.        Behavior: Behavior normal.        Thought Content: Thought content normal.        Judgment: Judgment normal.    Results for orders placed or performed during the hospital encounter of 03/10/24 (from the past 24 hours)  POCT URINE DIPSTICK     Status: Abnormal   Collection Time: 03/10/24  1:22 PM  Result Value Ref Range   Color, UA other (A) yellow   Clarity, UA hazy (A) clear   Glucose, UA =100 (A) negative mg/dL   Bilirubin, UA small (A) negative   Ketones, POC UA small (15) (A) negative mg/dL   Spec Grav, UA 8.974 8.989 - 1.025   Blood, UA trace-intact (A) negative   pH, UA 5.5 5.0 - 8.0   POC PROTEIN,UA =100 (A) negative, trace   Urobilinogen, UA 2.0 (A) 0.2 or 1.0 E.U./dL   Nitrite, UA Positive (A) Negative   Leukocytes, UA Large (3+) (A) Negative  POCT urine pregnancy     Status: None    Collection Time: 03/10/24  1:22 PM  Result Value Ref Range   Preg Test, Ur Negative Negative    Assessment and Plan :   PDMP not reviewed this encounter.  1. Acute cystitis with hematuria   2. Dysuria   3. Vaginal irritation    Start Keflex  to cover for acute cystitis, urine culture pending. Diflucan  for yeast infection.  Recommended consistent hydration, limiting urinary irritants. Counseled patient on potential for adverse effects with medications prescribed/recommended today, ER and return-to-clinic precautions discussed, patient verbalized understanding.    Christopher Savannah, PA-C 03/10/24 1330

## 2024-03-10 NOTE — ED Triage Notes (Signed)
 Pt c/o pain after urination, vaginal irritation x 1 week-no relief with AZO-NAD-steady gait

## 2024-03-11 LAB — URINE CULTURE: Culture: <10000 — AB

## 2024-03-12 ENCOUNTER — Ambulatory Visit (HOSPITAL_COMMUNITY): Payer: Self-pay

## 2024-03-12 LAB — CERVICOVAGINAL ANCILLARY ONLY
Bacterial Vaginitis (gardnerella): POSITIVE — AB
Candida Glabrata: NEGATIVE
Candida Vaginitis: POSITIVE — AB
Chlamydia: NEGATIVE
Comment: NEGATIVE
Comment: NEGATIVE
Comment: NEGATIVE
Comment: NEGATIVE
Comment: NEGATIVE
Comment: NORMAL
Neisseria Gonorrhea: NEGATIVE
Trichomonas: NEGATIVE

## 2024-03-12 MED ORDER — FLUCONAZOLE 150 MG PO TABS: 150.0000 mg | ORAL_TABLET | Freq: Once | ORAL | 0 refills | Status: AC

## 2024-03-12 MED ORDER — METRONIDAZOLE 500 MG PO TABS: 500.0000 mg | ORAL_TABLET | Freq: Two times a day (BID) | ORAL | 0 refills | Status: AC

## 2024-03-13 ENCOUNTER — Ambulatory Visit: Admitting: Clinical

## 2024-03-13 DIAGNOSIS — F4323 Adjustment disorder with mixed anxiety and depressed mood: Secondary | ICD-10-CM

## 2024-03-13 NOTE — Progress Notes (Signed)
 [facility] Counselor Progress Note - TELEMEDICINE VISIT  Patient ID: Yvette Kerr, MRN: [id number]    Date: 03/13/24  Time Spent: 1pm   - 1:50pm  : 50 Minutes  Types of Service: Individual psychotherapy and Video visit  Client and/or Legal Guardian location: Work office - [place], [place] Therapist location: [facility] [employer] - [place], [place] All persons participating in visit: Client & this therapist  I connected with client and/or legal guardian via Engineer, Civil (consulting)  (Video is Caregility application) and verified that I am speaking with the correct person using two identifiers. Discussed confidentiality: Yes   I discussed the limitations of telemedicine and the availability of in person appointments.  Discussed there is a possibility of technology failure and discussed alternative modes of communication if that failure occurs.  I discussed that engaging in this telemedicine visit, they consent to the provision of behavioral healthcare and the services will be billed under their insurance.  Client and/or legal guardian expressed understanding and consented to Telemedicine visit: Yes    Presenting Concerns:  - ongoing anxiety, especially in social situations  Mental Status Exam: Appearance:  Neat     Behavior: Appropriate  Motor: Normal  Speech/Language:  Normal Rate  Affect: Appropriate  Mood: anxious  Thought process: normal  Thought content:   WNL  Sensory/Perceptual disturbances:   WNL  Orientation: oriented to person, place, time/date, situation, and day of week  Attention: Good  Concentration: Good  Memory: WNL  Fund of knowledge:  Good  Insight:   Good  Judgment:  Good  Impulse Control: Good   Risk Assessment: Danger to Self:  No Self-injurious Behavior: No Danger to Others: No Duty to Warn:no   Subjective:  Yvette Kerr reported her boss allowed her to stay in his office for privacy during the visit.  She reported on  going anxiety in social situations.  Interventions: Cognitive Behavioral Therapy and Psycho-education/Bibliotherapy - psycho education on automatic thoughts, core beliefs & exposure therapy.  Client Response: Yvette Kerr reported that she's been practicing relaxation strategies, eg deep breathing, and positive self talk to decrease anxiety symptoms as she walks to work.  She reported she feels less anxious in that situation.  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 - She actively engaged in learning more about cognitive coping strategies and gained insight about her core beliefs that may be negatively impacting her.  Treatment Level 2-3 times a month  Modality - TeleMedicine - Video  Goals:   Improve coping skills by learning effective ways to adapt and manage anxiety.   Objectives: Target Date For All Objectives: 01/17/2025   Enhance problem-solving abilities and develop skills to address and resolve challenges.,   Interrupt negative thinking and increase positive thoughts and feelings, and Practice mindfulness    Target Date: 01/17/2025  Progress: Making progress    Mohamedamin Nifong P. Trudy, MSW, LCSW [employer] Therapist Main Office: [phone]

## 2024-03-14 ENCOUNTER — Encounter: Payer: Self-pay | Admitting: Clinical

## 2024-03-14 LAB — MISC LABCORP TEST (SEND OUT): Labcorp test code: 180076

## 2024-03-21 ENCOUNTER — Telehealth: Payer: Self-pay

## 2024-03-21 DIAGNOSIS — B9689 Other specified bacterial agents as the cause of diseases classified elsewhere: Secondary | ICD-10-CM

## 2024-03-21 NOTE — Telephone Encounter (Signed)
 TC from patient requesting Flagyl . Patient states it was not sent in to the pharmacy. Instructions discussed with patient per Andrea's telephone note.   Pharmacy verified with patient.

## 2024-03-26 MED ORDER — METRONIDAZOLE 500 MG PO TABS: 500.0000 mg | ORAL_TABLET | Freq: Two times a day (BID) | ORAL | 0 refills | Status: AC

## 2024-03-26 NOTE — Telephone Encounter (Signed)
 TC to pharmacy. They never received rx for Flagyl . Please re-send thank you!

## 2024-03-29 ENCOUNTER — Telehealth: Payer: Self-pay | Admitting: Clinical

## 2024-03-29 ENCOUNTER — Ambulatory Visit: Admitting: Clinical

## 2024-03-29 DIAGNOSIS — F4323 Adjustment disorder with mixed anxiety and depressed mood: Secondary | ICD-10-CM

## 2024-03-29 NOTE — Progress Notes (Signed)
 [facility] Counselor Progress Note - TELEMEDICINE VISIT  Patient ID: Yvette Kerr, MRN: [id number]    Date: 03/29/24 3:11pm Sent video link [phone]  Time Spent: 3:15pm   - 3:57pm  : 42 Minutes  Types of Service: Individual psychotherapy and Video visit  Client and/or Legal Guardian location: Client home-[place], [place] Therapist location: [facility] - [place], [place] All persons participating in visit: Client & this therapist  I connected with client and/or legal guardian via Video Enabled Telemedicine Application  (Video is Caregility application) and verified that I am speaking with the correct person using two identifiers. Discussed confidentiality: Yes   I discussed the limitations of telemedicine and the availability of in person appointments.  Discussed there is a possibility of technology failure and discussed alternative modes of communication if that failure occurs.  I discussed that engaging in this telemedicine visit, they consent to the provision of behavioral healthcare and the services will be billed under their insurance.  Client and/or legal guardian expressed understanding and consented to Telemedicine visit: Yes    Presenting Concerns:  - anxious about future goals she's trying to accomplish and adjusting to current phase in her life  Mental Status Exam: Appearance:  Casual     Behavior: Appropriate  Motor: Normal  Speech/Language:  Normal Rate  Affect: Appropriate  Mood: anxious  Thought process: normal  Thought content:   WNL  Sensory/Perceptual disturbances:   WNL  Orientation: oriented to person, place, time/date, situation, and day of week  Attention: Good  Concentration: Good  Memory: WNL  Fund of knowledge:  Good  Insight:   Good  Judgment:  Good  Impulse Control: Good   Risk Assessment: Danger to Self:  No Self-injurious Behavior: No Danger to Others: No Duty to Warn:no   Subjective:  Yvette Kerr reported she  was able to get through situations over the holiday that made her feel anxious.  She continues to be anxious walking to work but implementing strategies that's helped her feel less anxious.  At this time, Yvette Kerr is working towards her goal to buy a car.   Interventions: Mindfulness Meditation and Solution-Oriented/Positive Psychology- Review of mindfulness strategies that she's implementing.  Developed solutions to help her towards her goal with buying a car.  Client Response: Yvette Kerr reported she's been practicing mindfulness strategies that's helped her as she walks to work.  She was able to get through situations over the holiday with support from her family and positive self-talk.  Yvette Kerr shared her goal with buying a car and concerns about that.  She's been able to save money for a car but didn't realize all the other factors that goes into owning a car, eg insurance, registration, maintenance, etc.   Diagnosis:  Adjustment disorder with mixed anxiety and depressed mood   Goals, Assessment & Plan:   Yvette Kerr has increased her knowledge & implementation of coping strategies to decrease her anxiety symptoms.  She is enhancing her problem solving skills to accomplish her goal with owning a car.  Yvette Kerr was open to exploring other goals for herself.  This Clinician will send worksheets about values & developing goals for her to review.  Treatment Level 2-3 times a month   Modality - TeleMedicine - Video   Goals:   Improve coping skills by learning effective ways to adapt and manage anxiety.   Objectives:    Enhance problem-solving abilities and develop skills to address and resolve challenges. - Yvette Kerr increased her knowledge on owning a car and  enhancing her ability to address those challenges.   Interrupt negative thinking and increase positive thoughts and feelings, and practice mindfulness. - Yvette Kerr has been practicing mindfulness exercises     Target Date:  01/17/2025  Progress: Making progress       Bianca Raneri P. Trudy, MSW, LCSW [employer] Therapist Main Office: [phone]

## 2024-04-02 ENCOUNTER — Encounter: Payer: Self-pay | Admitting: Clinical

## 2024-04-18 ENCOUNTER — Ambulatory Visit: Admitting: Clinical

## 2024-04-20 ENCOUNTER — Ambulatory Visit: Admitting: Clinical

## 2024-04-20 NOTE — Progress Notes (Unsigned)
 [facility] Counselor/Therapist Progress Note - IN-PERSON  Patient ID: Yvette Kerr, MRN: [id number]    Date: 04/20/2024  Time Spent: ***   - ***  : *** Minutes  Types of Service: {CHL AMB TYPE OF [phone]}  Presenting Concerns:  ***  Mental Status Exam: Appearance:  {PSY:22683}     Behavior: {PSY:21022743}  Motor: {PSY:22302}  Speech/Language:  {PSY:22685}  Affect: {PSY:22687}  Mood: {PSY:31886}  Thought process: {PSY:31888}  Thought content:   {[phone]}  Sensory/Perceptual disturbances:   {PSY:7340935268}  Orientation: {PSY:30297}  Attention: {PSY:22877}  Concentration: {PSY:539-484-0010}  Memory: {PSY:917-145-2669}  Fund of knowledge:  {PSY:539-484-0010}  Insight:   {PSY:539-484-0010}  Judgment:  {PSY:539-484-0010}  Impulse Control: {PSY:539-484-0010}   Risk Assessment: Danger to Self:  {PSY:22692} Self-injurious Behavior: {PSY:22692} Danger to Others: {PSY:22692} Duty to Warn:{PSY:311194}   Subjective:  ***   Interventions: {PSY:401-778-5044}  Client Response: ***   Diagnosis:  No diagnosis found.   Goals, Assessment & Plan:   Treatment Level 2-3 times a month   Modality - TeleMedicine - Video   Goals:   Improve coping skills by learning effective ways to adapt and manage anxiety.   Objectives:    Enhance problem-solving abilities and develop skills to address and resolve challenges.  *** - Yvette Kerr increased her knowledge on owning a car and enhancing her ability to address those challenges.   Interrupt negative thinking and increase positive thoughts and feelings, and practice mindfulness.  ***     Target Date: 01/17/2025  Progress: Making progress   *** Frequency: ***  Modality: ***    Goal: ***  Target Date: ***  Progress: ***     Goal: ***  Target Date: ***  Progress: ***    Yvette Kerr P. Yvette Kerr, MSW, LCSW [employer] Therapist Main Office: [phone]

## 2024-05-04 ENCOUNTER — Ambulatory Visit: Admitting: Clinical

## 2024-05-04 DIAGNOSIS — F4323 Adjustment disorder with mixed anxiety and depressed mood: Secondary | ICD-10-CM

## 2024-05-04 NOTE — Progress Notes (Unsigned)
 [facility] Counselor Progress Note - TELEMEDICINE VISIT  Patient ID: Yvette Kerr, MRN: [id number]    Date: 05/04/2024  Time Spent: 10:30am   - 11:25am  : 55 Minutes  Types of Service: Individual psychotherapy and Video visit  Client and/or Legal Guardian location: Client's home - [place], [place] Therapist location:  [facility] Office [place], [place] All persons participating in visit: Client & this therapist  I connected with client and/or legal guardian via Video Enabled Telemedicine Application  (Video is Caregility application) and verified that I am speaking with the correct person using two identifiers. Discussed confidentiality: Yes   I discussed the limitations of telemedicine and the availability of in person appointments.  Discussed there is a possibility of technology failure and discussed alternative modes of communication if that failure occurs.  I discussed that engaging in this telemedicine visit, they consent to the provision of behavioral healthcare and the services will be billed under their insurance.  Client and/or legal guardian expressed understanding and consented to Telemedicine visit: Yes    Types of Service: Individual psychotherapy and Video visit  Presenting Concerns:  - Financial stressors and figuring out her future plans  Mental Status Exam: Appearance:  Casual     Behavior: Appropriate  Motor: Normal  Speech/Language:  Normal Rate  Affect: Appropriate  Mood: anxious  Thought process: normal  Thought content:   WNL  Sensory/Perceptual disturbances:   WNL  Orientation: oriented to person, place, time/date, situation, and day of week  Attention: Good  Concentration: Good  Memory: WNL  Fund of knowledge:  Good  Insight:   Good  Judgment:  Good  Impulse Control: Good   Risk Assessment: Danger to Self:  No Self-injurious Behavior: No Danger to Others: No Duty to Warn:no   Subjective:  Yvette Kerr reported her concerns  with her future and her weight.  Interventions: Solution-Oriented/Positive Psychology  Client Response: Yvette Kerr reported she feels like she needs to be doing more things besides working.  She stated she increased her work hours at [employer] so she can save up for a car.  Yvette Kerr reported she plans on starting general studies at [employer] and attending a [employer].  Yvette Kerr shared her concerns about not gaining weight. She reported she spoke with her doctor about this and started prenatal vitamins.  24 hours Recall: B: eggs, bacon, toast at work French Fries at work 4-5 chicken tenders Salad Chicken tenders [employer]  She reported she usually prefers home cooked meals that include rice, steak and other meats.  She will work on cooking more meals.  Sleep: 11pm-8am is when she typically sleeps and hasn't had any recent problems with sleep.  She reported being open to increase physical activities, including relaxation exercises or 5 min workout video to be healthier. She's noticed she clenches her jaw/teeth which may be a response from stress.   Diagnosis:  Adjustment disorder with mixed anxiety and depressed mood   Goals, Assessment & Plan:  Yvette Kerr was open to exploring other goals for herself.  This Clinician will send worksheets about values & developing goals for her to review.   Treatment Level 2-3 times a month   Modality - TeleMedicine - Video or In-Person   Goals:   Improve coping skills by learning effective ways to adapt and manage anxiety.   Objectives:    Enhance problem-solving abilities and develop skills to address and resolve challenges. 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  Interrupt negative thinking and increase positive thoughts and feelings, and practice mindfulness.  - Yvette Kerr will start with stretches after she wakes up to relax her mind & body, instead of rushing to get ready in  the morning.   - Yvette Kerr reported that getting a piercing this week will help her feel better.    [employer] Date: 01/17/2025  Progress: Making progress    Mir Fullilove P. Trudy, MSW, LCSW [employer] Therapist Main Office: [phone]

## 2024-05-16 ENCOUNTER — Other Ambulatory Visit: Payer: Self-pay | Admitting: Family Medicine

## 2024-05-16 ENCOUNTER — Encounter: Payer: Self-pay | Admitting: Family Medicine

## 2024-05-16 DIAGNOSIS — N926 Irregular menstruation, unspecified: Secondary | ICD-10-CM

## 2024-05-16 NOTE — Telephone Encounter (Signed)
Looks like referral was denied.

## 2024-05-18 ENCOUNTER — Encounter: Payer: Self-pay | Admitting: Clinical

## 2024-05-18 ENCOUNTER — Ambulatory Visit: Admitting: Clinical

## 2024-05-18 NOTE — Progress Notes (Unsigned)
                Yvette Kerr Yvette Pouch, LCSW

## 2024-06-01 ENCOUNTER — Ambulatory Visit: Admitting: Clinical

## 2024-06-22 ENCOUNTER — Ambulatory Visit: Admitting: Clinical

## 2024-07-06 ENCOUNTER — Ambulatory Visit: Admitting: Clinical
